# Patient Record
Sex: Female | Born: 1935 | Race: White | Hispanic: No | Smoking: Never smoker
Health system: Southern US, Community
[De-identification: ages and names within clinical notes are randomized; demographics above are authoritative.]

## PROBLEM LIST (undated history)

## (undated) DIAGNOSIS — E164 Increased secretion of gastrin: Secondary | ICD-10-CM

## (undated) DIAGNOSIS — C7A00 Malignant carcinoid tumor of unspecified site: Secondary | ICD-10-CM

## (undated) DIAGNOSIS — I10 Essential (primary) hypertension: Secondary | ICD-10-CM

## (undated) DIAGNOSIS — F329 Major depressive disorder, single episode, unspecified: Secondary | ICD-10-CM

## (undated) DIAGNOSIS — C73 Malignant neoplasm of thyroid gland: Secondary | ICD-10-CM

## (undated) DIAGNOSIS — I499 Cardiac arrhythmia, unspecified: Secondary | ICD-10-CM

## (undated) DIAGNOSIS — E039 Hypothyroidism, unspecified: Secondary | ICD-10-CM

## (undated) DIAGNOSIS — F32A Depression, unspecified: Secondary | ICD-10-CM

## (undated) DIAGNOSIS — K219 Gastro-esophageal reflux disease without esophagitis: Secondary | ICD-10-CM

## (undated) DIAGNOSIS — M1990 Unspecified osteoarthritis, unspecified site: Secondary | ICD-10-CM

## (undated) HISTORY — DX: Malignant carcinoid tumor of unspecified site: C7A.00

## (undated) HISTORY — DX: Increased secretion of gastrin: E16.4

## (undated) HISTORY — PX: ABDOMINAL HYSTERECTOMY: SHX81

## (undated) HISTORY — DX: Malignant neoplasm of thyroid gland: C73

## (undated) HISTORY — PX: LYSIS OF ADHESION: SHX5961

## (undated) HISTORY — PX: THYROID SURGERY: SHX805

## (undated) HISTORY — PX: OOPHORECTOMY: SHX86

---

## 2012-04-21 ENCOUNTER — Emergency Department: Payer: Self-pay | Admitting: *Deleted

## 2012-04-21 LAB — URINALYSIS, COMPLETE
Bacteria: NONE SEEN
Blood: NEGATIVE
Glucose,UR: NEGATIVE mg/dL (ref 0–75)
Ph: 6 (ref 4.5–8.0)
RBC,UR: 1 /HPF (ref 0–5)
Squamous Epithelial: <1
WBC UR: 3 /HPF (ref 0–5)

## 2012-04-21 LAB — COMPREHENSIVE METABOLIC PANEL
Albumin: 3.7 g/dL (ref 3.4–5.0)
Alkaline Phosphatase: 84 U/L (ref 50–136)
BUN: 15 mg/dL (ref 7–18)
Bilirubin,Total: 0.3 mg/dL (ref 0.2–1.0)
Calcium, Total: 8.2 mg/dL — ABNORMAL LOW (ref 8.5–10.1)
Creatinine: 0.83 mg/dL (ref 0.60–1.30)
EGFR (African American): >60
EGFR (Non-African Amer.): >60
Glucose: 151 mg/dL — ABNORMAL HIGH (ref 65–99)
Osmolality: 274 (ref 275–301)
Potassium: 2.9 mmol/L — ABNORMAL LOW (ref 3.5–5.1)
SGPT (ALT): 25 U/L (ref 12–78)
Sodium: 135 mmol/L — ABNORMAL LOW (ref 136–145)
Total Protein: 6.5 g/dL (ref 6.4–8.2)

## 2012-04-21 LAB — CBC
MCH: 31.2 pg (ref 26.0–34.0)
MCV: 91 fL (ref 80–100)
Platelet: 223 10*3/uL (ref 150–440)
RBC: 4.29 10*6/uL (ref 3.80–5.20)
RDW: 13.3 % (ref 11.5–14.5)

## 2012-04-21 LAB — CK TOTAL AND CKMB (NOT AT ARMC): CK, Total: 67 U/L (ref 21–215)

## 2016-01-04 ENCOUNTER — Encounter: Payer: Self-pay | Admitting: "Endocrinology

## 2016-01-04 ENCOUNTER — Ambulatory Visit (INDEPENDENT_AMBULATORY_CARE_PROVIDER_SITE_OTHER): Payer: Medicare Other | Admitting: "Endocrinology

## 2016-01-04 VITALS — BP 145/80 | HR 76 | Ht 67.0 in | Wt 153.0 lb

## 2016-01-04 DIAGNOSIS — E890 Postprocedural hypothyroidism: Secondary | ICD-10-CM | POA: Insufficient documentation

## 2016-01-04 MED ORDER — LEVOTHYROXINE SODIUM 125 MCG PO TABS: 125.0000 ug | ORAL_TABLET | Freq: Every day | ORAL | Status: DC

## 2016-01-04 NOTE — Progress Notes (Signed)
Subjective:    Patient ID: Gwendolyn Bennett, female    DOB: 1927-08-25, PCP Gwendolyn Betters, FNP   Past Medical History  Diagnosis Date  . Zollinger-Ellison syndrome   . Thyroid cancer ([place])   . Malignant carcinoid tumor [facility])    Past Surgical History  Procedure Laterality Date  . Oophorectomy    . Abdominal hysterectomy     Social History   Social History  . Marital Status: Widowed    Spouse Name: N/A  . Number of Children: N/A  . Years of Education: N/A   Social History Main Topics  . Smoking status: Never Smoker   . Smokeless tobacco: None  . Alcohol Use: No  . Drug Use: No  . Sexual Activity: Not Asked   Other Topics Concern  . None   Social History Narrative  . None   Outpatient Encounter Prescriptions as of 01/04/2016  Medication Sig  . atenolol (TENORMIN) 50 MG tablet Take 50 mg by mouth daily.  . calcitRIOL (ROCALTROL) 0.25 MCG capsule Take 0.25 mcg by mouth daily.  . clidinium-chlordiazePOXIDE (LIBRAX) 5-2.5 MG capsule Take 1 capsule by mouth.  . CVS BIOTIN PO Take by mouth.  . esomeprazole (NEXIUM) 40 MG capsule Take 40 mg by mouth 2 (two) times daily before a meal.  . ezetimibe-simvastatin (VYTORIN) 10-20 MG tablet Take 1 tablet by mouth daily.  Marland Kitchen FLUoxetine (PROZAC) 10 MG tablet Take 10 mg by mouth daily.  Marland Kitchen gabapentin (NEURONTIN) 100 MG capsule Take 100 mg by mouth 3 (three) times daily.  . hydrochlorothiazide (HYDRODIURIL) 25 MG tablet Take 25 mg by mouth daily.  Marland Kitchen levothyroxine (SYNTHROID, LEVOTHROID) 125 MCG tablet Take 1 tablet (125 mcg total) by mouth daily before breakfast.  . LORazepam (ATIVAN) 1 MG tablet Take 1 mg by mouth at bedtime.  . [DISCONTINUED] levothyroxine (SYNTHROID, LEVOTHROID) 125 MCG tablet Take 125 mcg by mouth daily before breakfast.   No facility-administered encounter medications on file as of 01/04/2016.   ALLERGIES: Allergies  Allergen Reactions  . Amoxicillin-Pot Clavulanate   . Levaquin [Levofloxacin In D5w]   .  Nitrofuran Derivatives   . Sulfa Antibiotics    VACCINATION STATUS:  There is no immunization history on file for this patient.  HPI Gwendolyn Bennett is 83 year old female patient with medical history as above. She is being seen in consultation for postsurgical hypothyroidism requested by Gwendolyn Betters, FNP. -She reports that she underwent thyroidectomy in 1999 for unidentified thyroid cancer. She was treated with various doses of levothyroxine. In February, 2017  her TSH was above target when she was taking levothyroxine 112 g daily. Since then her levothyroxine was increased to 125 g by mouth every morning. Patient reports compliance. She has seen Dr. Myles Gip in [place] before he moved.  - Her records are not available to review. She denies cold/heat intolerance. She has steady body weight. - She denies family history of thyroid cancer. She denies any exposure to neck radiation.  Review of Systems Constitutional: no weight gain/loss, no fatigue, no subjective hyperthermia/hypothermia Eyes: no blurry vision, no xerophthalmia ENT: no sore throat, no nodules palpated in throat, no dysphagia/odynophagia, no hoarseness Cardiovascular: no CP/SOB/palpitations/leg swelling Respiratory: no cough/SOB Gastrointestinal: no N/V/D/C Musculoskeletal: no muscle/joint aches Skin: no rashes Neurological: no tremors/numbness/tingling/dizziness Psychiatric: no depression/anxiety  Objective:    BP 145/80 mmHg  Pulse 76  Ht 5\' 7"  (1.702 m)  Wt 153 lb (69.4 kg)  BMI 23.96 kg/m2  SpO2 96%  Wt Readings from Last 3 Encounters:  01/04/16 153 lb (69.4 kg)    Physical Exam Constitutional:  in NAD Eyes: PERRLA, EOMI, no exophthalmos ENT: moist mucous membranes, post thyroidectomy scar on anterior lower neck, no cervical lymphadenopathy Cardiovascular: RRR, No MRG Respiratory: CTA B Gastrointestinal: abdomen soft, NT, ND, BS+ Musculoskeletal: no deformities, strength intact in all 4 Skin: moist,  warm, no rashes Neurological: no tremor with outstretched hands, DTR normal in all 4   CMP     Component Value Date/Time   NA 135* 04/21/2012 2006   K 2.9* 04/21/2012 2006   CL 99 04/21/2012 2006   CO2 27 04/21/2012 2006   GLUCOSE 151* 04/21/2012 2006   BUN 15 04/21/2012 2006   CREATININE 0.83 04/21/2012 2006   CALCIUM 8.2* 04/21/2012 2006   PROT 6.5 04/21/2012 2006   ALBUMIN 3.7 04/21/2012 2006   AST 26 04/21/2012 2006   ALT 25 04/21/2012 2006   ALKPHOS 84 04/21/2012 2006   BILITOT 0.3 04/21/2012 2006   GFRNONAA >60 04/21/2012 2006   GFRAA >60 04/21/2012 2006     Assessment & Plan:   1. Postoperative hypothyroidism 2. Thyroid malignancy treated in the late 90s, records not available for review today  -I will obtain thyroid function test today. I advised her to continue on levothyroxine 125 g by mouth every morning.  - We discussed about correct intake of levothyroxine, at fasting, with water, separated by at least 30 minutes from breakfast, and separated by more than 4 hours from calcium, iron, multivitamins, acid reflux medications (PPIs). -Patient is made aware of the fact that thyroid hormone replacement is needed for life, dose to be adjusted by periodic monitoring of thyroid function tests.  -I will request for her old records to review. If thyroid cancer surveillance is not reassuring, I will obtain additional imaging studies after her next visit.  - 45 minutes of time was spent on the care of this patient , 50% of which was applied for counseling on diabetes complications and their preventions.  - I advised patient to maintain close follow up with Gwendolyn Betters, FNP for primary care needs. Follow up plan: Return in about 1 week (around 01/11/2016), or she will get records from Dr. Marveen Reeks , for underactive thyroid, thyroid cancer, labs today.  Glade Lloyd, MD Phone: [phone]  Fax: [phone]   01/04/2016, 5:04 PM

## 2016-01-05 LAB — T4, FREE: FREE T4: 1.9 ng/dL — AB (ref 0.8–1.8)

## 2016-01-05 LAB — TSH: TSH: 0.42 m[IU]/L

## 2016-01-12 ENCOUNTER — Encounter: Payer: Self-pay | Admitting: "Endocrinology

## 2016-01-12 ENCOUNTER — Ambulatory Visit (INDEPENDENT_AMBULATORY_CARE_PROVIDER_SITE_OTHER): Payer: Medicare Other | Admitting: "Endocrinology

## 2016-01-12 VITALS — BP 133/73 | HR 73 | Ht 67.0 in | Wt 155.0 lb

## 2016-01-12 DIAGNOSIS — E038 Other specified hypothyroidism: Secondary | ICD-10-CM | POA: Diagnosis not present

## 2016-01-12 MED ORDER — LEVOTHYROXINE SODIUM 112 MCG PO TABS: 112.0000 ug | ORAL_TABLET | Freq: Every day | ORAL | Status: DC

## 2016-01-12 NOTE — Progress Notes (Signed)
Subjective:    Patient ID: Gwendolyn Bennett, female    DOB: 03/18/1928, PCP Bronwen Betters, FNP   Past Medical History  Diagnosis Date  . Zollinger-Ellison syndrome   . Thyroid cancer ([place])   . Malignant carcinoid tumor [facility])    Past Surgical History  Procedure Laterality Date  . Oophorectomy    . Abdominal hysterectomy     Social History   Social History  . Marital Status: Widowed    Spouse Name: N/A  . Number of Children: N/A  . Years of Education: N/A   Social History Main Topics  . Smoking status: Never Smoker   . Smokeless tobacco: None  . Alcohol Use: No  . Drug Use: No  . Sexual Activity: Not Asked   Other Topics Concern  . None   Social History Narrative   Outpatient Encounter Prescriptions as of 01/12/2016  Medication Sig  . atenolol (TENORMIN) 50 MG tablet Take 50 mg by mouth daily.  . calcitRIOL (ROCALTROL) 0.25 MCG capsule Take 0.25 mcg by mouth daily.  . clidinium-chlordiazePOXIDE (LIBRAX) 5-2.5 MG capsule Take 1 capsule by mouth.  . CVS BIOTIN PO Take by mouth.  . esomeprazole (NEXIUM) 40 MG capsule Take 40 mg by mouth 2 (two) times daily before a meal.  . ezetimibe-simvastatin (VYTORIN) 10-20 MG tablet Take 1 tablet by mouth daily.  Marland Kitchen FLUoxetine (PROZAC) 10 MG tablet Take 10 mg by mouth daily.  Marland Kitchen gabapentin (NEURONTIN) 100 MG capsule Take 100 mg by mouth 3 (three) times daily.  . hydrochlorothiazide (HYDRODIURIL) 25 MG tablet Take 25 mg by mouth daily.  Marland Kitchen levothyroxine (SYNTHROID, LEVOTHROID) 112 MCG tablet Take 1 tablet (112 mcg total) by mouth daily.  Marland Kitchen LORazepam (ATIVAN) 1 MG tablet Take 1 mg by mouth at bedtime.  . [DISCONTINUED] levothyroxine (SYNTHROID, LEVOTHROID) 125 MCG tablet Take 1 tablet (125 mcg total) by mouth daily before breakfast.   No facility-administered encounter medications on file as of 01/12/2016.   ALLERGIES: Allergies  Allergen Reactions  . Amoxicillin-Pot Clavulanate   . Levaquin [Levofloxacin In D5w]   . Nitrofuran  Derivatives   . Sulfa Antibiotics    VACCINATION STATUS:  There is no immunization history on file for this patient.  HPI Gwendolyn Bennett is 82 year old female patient with medical history as above. She is being seen in follow-up for postsurgical hypothyroidism is new set of thyroid function tests. She is currently on Synthroid 125 g by mouth every morning. She has no new complaints.    She denies cold/heat intolerance. She has steady body weight. - She denies family history of thyroid cancer. She denies any exposure to neck radiation.  Review of Systems Constitutional: no weight gain/loss, no fatigue, no subjective hyperthermia/hypothermia Eyes: no blurry vision, no xerophthalmia ENT: no sore throat, no nodules palpated in throat, no dysphagia/odynophagia, no hoarseness Cardiovascular: no CP/SOB/palpitations/leg swelling Respiratory: no cough/SOB Gastrointestinal: no N/V/D/C Musculoskeletal: no muscle/joint aches Skin: no rashes Neurological: no tremors/numbness/tingling/dizziness Psychiatric: no depression/anxiety  Objective:    BP 133/73 mmHg  Pulse 73  Ht 5\' 7"  (1.702 m)  Wt 155 lb (70.308 kg)  BMI 24.27 kg/m2  SpO2 96%  Wt Readings from Last 3 Encounters:  01/12/16 155 lb (70.308 kg)  01/04/16 153 lb (69.4 kg)    Physical Exam Constitutional:  in NAD Eyes: PERRLA, EOMI, no exophthalmos ENT: moist mucous membranes, post thyroidectomy scar on anterior lower neck, no cervical lymphadenopathy Cardiovascular: RRR, No MRG Respiratory: CTA B Gastrointestinal: abdomen soft, NT, ND, BS+ Musculoskeletal:  no deformities, strength intact in all 4 Skin: moist, warm, no rashes Neurological: no tremor with outstretched hands, DTR normal in all 4  Recent Results (from the past 2160 hour(s))  TSH     Status: None   Collection Time: 01/04/16  2:33 PM  Result Value Ref Range   TSH 0.42 mIU/L    Comment:   Reference Range   > or = 20 Years  0.40-4.50   Pregnancy Range First  trimester  0.26-2.66 Second trimester 0.55-2.73 Third trimester  0.43-2.91     T4, free     Status: Abnormal   Collection Time: 01/04/16  2:33 PM  Result Value Ref Range   Free T4 1.9 (H) 0.8 - 1.8 ng/dL     Assessment & Plan:   1. Postoperative hypothyroidism 2. Thyroid malignancy treated in the late 90s, records not available for review today  -Based on her new set of thyroid function tests, her dose will be lowered to 112 g of Synthroid by mouth every morning.   - We discussed about correct intake of levothyroxine, at fasting, with water, separated by at least 30 minutes from breakfast, and separated by more than 4 hours from calcium, iron, multivitamins, acid reflux medications (PPIs). -Patient is made aware of the fact that thyroid hormone replacement is needed for life, dose to be adjusted by periodic monitoring of thyroid function tests.  -I will request for her old records to review. She states she will stop by Dr. Marveen Reeks office in [place] to get her old chart.  If thyroid cancer surveillance is not reassuring, I will obtain additional imaging studies after her next visit.  - I advised patient to maintain close follow up with Bronwen Betters, FNP for primary care needs. Follow up plan: Return in about 3 months (around 04/13/2016) for follow up with pre-visit labs.  Glade Lloyd, MD Phone: [phone]  Fax: [phone]   01/12/2016, 10:04 AM

## 2016-04-21 ENCOUNTER — Other Ambulatory Visit: Payer: Self-pay | Admitting: "Endocrinology

## 2016-04-21 LAB — T4, FREE: FREE T4: 1.8 ng/dL (ref 0.8–1.8)

## 2016-04-21 LAB — TSH: TSH: 0.39 m[IU]/L — AB

## 2016-05-02 ENCOUNTER — Encounter: Payer: Self-pay | Admitting: "Endocrinology

## 2016-05-02 ENCOUNTER — Ambulatory Visit (INDEPENDENT_AMBULATORY_CARE_PROVIDER_SITE_OTHER): Payer: Medicare Other | Admitting: "Endocrinology

## 2016-05-02 VITALS — BP 151/82 | HR 69 | Wt 153.6 lb

## 2016-05-02 DIAGNOSIS — C73 Malignant neoplasm of thyroid gland: Secondary | ICD-10-CM

## 2016-05-02 DIAGNOSIS — E038 Other specified hypothyroidism: Secondary | ICD-10-CM

## 2016-05-02 DIAGNOSIS — Z85850 Personal history of malignant neoplasm of thyroid: Secondary | ICD-10-CM | POA: Insufficient documentation

## 2016-05-02 MED ORDER — LEVOTHYROXINE SODIUM 112 MCG PO TABS: 112.0000 ug | ORAL_TABLET | Freq: Every day | ORAL | 3 refills | Status: DC

## 2016-05-02 NOTE — Progress Notes (Signed)
Subjective:    Patient ID: Gwendolyn Bennett, female    DOB: [birth year], PCP Bronwen Betters, FNP   Past Medical History:  Diagnosis Date  . Malignant carcinoid tumor ([place])   . Thyroid cancer ([place])   . Zollinger-Ellison syndrome    Past Surgical History:  Procedure Laterality Date  . ABDOMINAL HYSTERECTOMY    . OOPHORECTOMY     Social History   Social History  . Marital status: Widowed    Spouse name: N/A  . Number of children: N/A  . Years of education: N/A   Social History Main Topics  . Smoking status: Never Smoker  . Smokeless tobacco: None  . Alcohol use No  . Drug use: No  . Sexual activity: Not Asked   Other Topics Concern  . None   Social History Narrative  . None   Outpatient Encounter Prescriptions as of 05/02/2016  Medication Sig  . atenolol (TENORMIN) 50 MG tablet Take 50 mg by mouth daily.  . calcitRIOL (ROCALTROL) 0.25 MCG capsule Take 0.25 mcg by mouth daily.  . clidinium-chlordiazePOXIDE (LIBRAX) 5-2.5 MG capsule Take 1 capsule by mouth.  . CVS BIOTIN PO Take by mouth.  . esomeprazole (NEXIUM) 40 MG capsule Take 40 mg by mouth 2 (two) times daily before a meal.  . ezetimibe-simvastatin (VYTORIN) 10-20 MG tablet Take 1 tablet by mouth daily.  Marland Kitchen FLUoxetine (PROZAC) 10 MG tablet Take 10 mg by mouth daily.  Marland Kitchen gabapentin (NEURONTIN) 100 MG capsule Take 100 mg by mouth 3 (three) times daily.  . hydrochlorothiazide (HYDRODIURIL) 25 MG tablet Take 25 mg by mouth daily.  Marland Kitchen levothyroxine (SYNTHROID, LEVOTHROID) 112 MCG tablet Take 1 tablet (112 mcg total) by mouth daily.  Marland Kitchen LORazepam (ATIVAN) 1 MG tablet Take 1 mg by mouth at bedtime.  . [DISCONTINUED] levothyroxine (SYNTHROID, LEVOTHROID) 112 MCG tablet Take 1 tablet (112 mcg total) by mouth daily.   No facility-administered encounter medications on file as of 05/02/2016.    ALLERGIES: Allergies  Allergen Reactions  . Amoxicillin-Pot Clavulanate   . Levaquin [Levofloxacin In D5w]   . Nitrofuran  Derivatives   . Sulfa Antibiotics    VACCINATION STATUS:  There is no immunization history on file for this patient.  Thyroid Problem  Presents for follow-up visit.   Mrs. Krupa is 82 year old female patient with medical history as above. She is being seen in follow-up for postsurgical hypothyroidism is new set of thyroid function tests. She is currently on Synthroid 112 g by mouth every morning. She has no new complaints.    She denies cold/heat intolerance. She has steady body weight. - She denies family history of thyroid cancer. She denies any exposure to neck radiation.  Review of Systems Constitutional: no weight gain/loss, no fatigue, no subjective hyperthermia/hypothermia Eyes: no blurry vision, no xerophthalmia ENT: no sore throat, no nodules palpated in throat, no dysphagia/odynophagia, no hoarseness Cardiovascular: no CP/SOB/palpitations/leg swelling Respiratory: no cough/SOB Gastrointestinal: no N/V/D/C Musculoskeletal: no muscle/joint aches Skin: no rashes Neurological: no tremors/numbness/tingling/dizziness Psychiatric: no depression/anxiety  Objective:    BP (!) 151/82   Pulse 69   Wt 153 lb 9.6 oz (69.7 kg)   BMI 24.06 kg/m   Wt Readings from Last 3 Encounters:  05/02/16 153 lb 9.6 oz (69.7 kg)  01/12/16 155 lb (70.3 kg)  01/04/16 153 lb (69.4 kg)    Physical Exam Constitutional:  in NAD Eyes: PERRLA, EOMI, no exophthalmos ENT: moist mucous membranes, post thyroidectomy scar on anterior lower neck, no cervical lymphadenopathy  Cardiovascular: RRR, No MRG Respiratory: CTA B Gastrointestinal: abdomen soft, NT, ND, BS+ Musculoskeletal: no deformities, strength intact in all 4 Skin: moist, warm, no rashes Neurological: no tremor with outstretched hands, DTR normal in all 4  Recent Results (from the past 2160 hour(s))  TSH     Status: Abnormal   Collection Time: 04/21/16 11:48 AM  Result Value Ref Range   TSH 0.39 (L) mIU/L    Comment:   Reference  Range   > or = 20 Years  0.40-4.50   Pregnancy Range First trimester  0.26-2.66 Second trimester 0.55-2.73 Third trimester  0.43-2.91     T4, free     Status: None   Collection Time: 04/21/16 11:48 AM  Result Value Ref Range   Free T4 1.8 0.8 - 1.8 ng/dL     Assessment & Plan:   1. Postoperative hypothyroidism 2. Thyroid malignancy treated in the late 90s, records not available for review today  -Based on her new set of thyroid function tests, her dose Of Synthroid at  112 g , seems to be the right dose.    - We discussed about correct intake of levothyroxine, at fasting, with water, separated by at least 30 minutes from breakfast, and separated by more than 4 hours from calcium, iron, multivitamins, acid reflux medications (PPIs). -Patient is made aware of the fact that thyroid hormone replacement is needed for life, dose to be adjusted by periodic monitoring of thyroid function tests.  - I will proceed to obtain thyroid/neck ultrasound given her history of thyroid cancer in the remote past. - I advised patient to maintain close follow up with Bronwen Betters, FNP for primary care needs. Follow up plan: Return in about 3 months (around 08/01/2016) for follow up with pre-visit labs, Thyroid / Neck Ultrasound.  Glade Lloyd, MD Phone: [phone]  Fax: [phone]   05/02/2016, 11:49 AM

## 2016-07-27 ENCOUNTER — Other Ambulatory Visit: Payer: Self-pay | Admitting: "Endocrinology

## 2016-07-27 ENCOUNTER — Ambulatory Visit (HOSPITAL_COMMUNITY)
Admission: RE | Admit: 2016-07-27 | Discharge: 2016-07-27 | Disposition: A | Discharge status: Home / Self Care | Payer: Medicare Other | Source: Ambulatory Visit | Attending: "Endocrinology | Admitting: "Endocrinology

## 2016-07-27 DIAGNOSIS — C73 Malignant neoplasm of thyroid gland: Secondary | ICD-10-CM | POA: Insufficient documentation

## 2016-07-27 LAB — T4, FREE: Free T4: 1.9 ng/dL — ABNORMAL HIGH (ref 0.8–1.8)

## 2016-07-27 LAB — TSH: TSH: 0.39 mIU/L — ABNORMAL LOW

## 2016-08-01 ENCOUNTER — Ambulatory Visit (INDEPENDENT_AMBULATORY_CARE_PROVIDER_SITE_OTHER): Payer: Medicare Other | Admitting: "Endocrinology

## 2016-08-01 ENCOUNTER — Ambulatory Visit: Payer: Medicare Other | Admitting: "Endocrinology

## 2016-08-01 ENCOUNTER — Encounter: Payer: Self-pay | Admitting: "Endocrinology

## 2016-08-01 VITALS — BP 133/82 | HR 91 | Ht 67.0 in | Wt 155.0 lb

## 2016-08-01 DIAGNOSIS — E890 Postprocedural hypothyroidism: Secondary | ICD-10-CM

## 2016-08-01 DIAGNOSIS — C73 Malignant neoplasm of thyroid gland: Secondary | ICD-10-CM | POA: Diagnosis not present

## 2016-08-01 MED ORDER — LEVOTHYROXINE SODIUM 100 MCG PO TABS: 100.0000 ug | ORAL_TABLET | Freq: Every day | ORAL | 1 refills | Status: DC

## 2016-08-01 NOTE — Progress Notes (Signed)
Subjective:    Patient ID: Gwendolyn Bennett, female    DOB: 1928/01/28, PCP Bronwen Betters, FNP   Past Medical History:  Diagnosis Date  . Malignant carcinoid tumor ([place])   . Thyroid cancer ([place])   . Zollinger-Ellison syndrome    Past Surgical History:  Procedure Laterality Date  . ABDOMINAL HYSTERECTOMY    . OOPHORECTOMY     Social History   Social History  . Marital status: Widowed    Spouse name: N/A  . Number of children: N/A  . Years of education: N/A   Social History Main Topics  . Smoking status: Never Smoker  . Smokeless tobacco: Never Used  . Alcohol use No  . Drug use: No  . Sexual activity: Not Asked   Other Topics Concern  . None   Social History Narrative  . None   Outpatient Encounter Prescriptions as of 08/01/2016  Medication Sig  . atenolol (TENORMIN) 50 MG tablet Take 50 mg by mouth daily.  . calcitRIOL (ROCALTROL) 0.25 MCG capsule Take 0.25 mcg by mouth daily.  . clidinium-chlordiazePOXIDE (LIBRAX) 5-2.5 MG capsule Take 1 capsule by mouth.  . CVS BIOTIN PO Take by mouth.  . esomeprazole (NEXIUM) 40 MG capsule Take 40 mg by mouth 2 (two) times daily before a meal.  . ezetimibe-simvastatin (VYTORIN) 10-20 MG tablet Take 1 tablet by mouth daily.  Marland Kitchen FLUoxetine (PROZAC) 10 MG tablet Take 10 mg by mouth daily.  Marland Kitchen gabapentin (NEURONTIN) 100 MG capsule Take 100 mg by mouth 3 (three) times daily.  . hydrochlorothiazide (HYDRODIURIL) 25 MG tablet Take 25 mg by mouth daily.  Marland Kitchen levothyroxine (SYNTHROID, LEVOTHROID) 100 MCG tablet Take 1 tablet (100 mcg total) by mouth daily before breakfast.  . LORazepam (ATIVAN) 1 MG tablet Take 1 mg by mouth at bedtime.  . [DISCONTINUED] levothyroxine (SYNTHROID, LEVOTHROID) 112 MCG tablet Take 1 tablet (112 mcg total) by mouth daily.   No facility-administered encounter medications on file as of 08/01/2016.    ALLERGIES: Allergies  Allergen Reactions  . Amoxicillin-Pot Clavulanate   . Levaquin [Levofloxacin In D5w]    . Nitrofuran Derivatives   . Sulfa Antibiotics    VACCINATION STATUS:  There is no immunization history on file for this patient.  Thyroid Problem  Presents for follow-up visit.   Gwendolyn Bennett is 82 year old female patient with medical history as above. She is being seen in follow-up for postsurgical hypothyroidism is new set of thyroid function tests. She is currently on Synthroid 112 g by mouth every morning. She has no new complaints.    She denies cold/heat intolerance. She has steady body weight. - She denies family history of thyroid cancer. She denies any exposure to neck radiation.  Review of Systems Constitutional: no weight gain/loss, no fatigue, no subjective hyperthermia/hypothermia Eyes: no blurry vision, no xerophthalmia ENT: no sore throat, no nodules palpated in throat, no dysphagia/odynophagia, no hoarseness Cardiovascular: no CP/SOB/palpitations/leg swelling Respiratory: no cough/SOB Gastrointestinal: no N/V/D/C Musculoskeletal: no muscle/joint aches Skin: no rashes Neurological: no tremors/numbness/tingling/dizziness Psychiatric: no depression/anxiety  Objective:    BP 133/82   Pulse 91   Ht 5\' 7"  (1.702 m)   Wt 155 lb (70.3 kg)   BMI 24.28 kg/m   Wt Readings from Last 3 Encounters:  08/01/16 155 lb (70.3 kg)  05/02/16 153 lb 9.6 oz (69.7 kg)  01/12/16 155 lb (70.3 kg)    Physical Exam Constitutional:  in NAD Eyes: PERRLA, EOMI, no exophthalmos ENT: moist mucous membranes, post thyroidectomy scar  on anterior lower neck, no cervical lymphadenopathy Cardiovascular: RRR, No MRG Respiratory: CTA B Gastrointestinal: abdomen soft, NT, ND, BS+ Musculoskeletal: no deformities, strength intact in all 4 Skin: moist, warm, no rashes Neurological: no tremor with outstretched hands, DTR normal in all 4  Recent Results (from the past 2160 hour(s))  TSH     Status: Abnormal   Collection Time: 07/27/16 10:14 AM  Result Value Ref Range   TSH 0.39 (L) mIU/L     Comment:   Reference Range   > or = 20 Years  0.40-4.50   Pregnancy Range First trimester  0.26-2.66 Second trimester 0.55-2.73 Third trimester  0.43-2.91     T4, free     Status: Abnormal   Collection Time: 07/27/16 10:14 AM  Result Value Ref Range   Free T4 1.9 (H) 0.8 - 1.8 ng/dL   - Thyroid/neck ultrasound  on 07/27/2016 is consistent with surgically absent thyroid.  Assessment & Plan:   1. Postoperative hypothyroidism 2. Thyroid malignancy treated in the late 90s, records not available for review today  -Based on her new set of thyroid function tests, She is being over replaced. I discussed and lowered her Synthroid to 100 g by mouth every morning with plan to repeat thyroid function test in 6 months with office visit.  - We discussed about correct intake of levothyroxine, at fasting, with water, separated by at least 30 minutes from breakfast, and separated by more than 4 hours from calcium, iron, multivitamins, acid reflux medications (PPIs). -Patient is made aware of the fact that thyroid hormone replacement is needed for life, dose to be adjusted by periodic monitoring of thyroid function tests.  - Her surveillance neck/I wrote ultrasound was unremarkable for surgically absent thyroid, no need for further intervention or workup. - I advised patient to maintain close follow up with Bronwen Betters, FNP for primary care needs. Follow up plan: Return in about 6 months (around 01/30/2017) for follow up with pre-visit labs.  Glade Lloyd, MD Phone: [phone]  Fax: [phone]   08/01/2016, 11:15 AM

## 2016-09-20 ENCOUNTER — Ambulatory Visit: Payer: Self-pay | Admitting: Orthopedic Surgery

## 2016-10-04 ENCOUNTER — Ambulatory Visit: Payer: Self-pay | Admitting: Orthopedic Surgery

## 2016-10-04 NOTE — H&P (Signed)
TOTAL HIP ADMISSION H&P  Patient is admitted for right total hip arthroplasty.  Subjective:  Chief Complaint: right hip pain  HPI: Gwendolyn Bennett, 83 y.o. female, has a history of pain and functional disability in the right hip(s) due to arthritis and patient has failed non-surgical conservative treatments for greater than 12 weeks to include NSAID's and/or analgesics, flexibility and strengthening excercises, use of assistive devices, weight reduction as appropriate and activity modification.  Onset of symptoms was gradual starting 3 years ago with gradually worsening course since that time.The patient noted no past surgery on the right hip(s).  Patient currently rates pain in the right hip at 10 out of 10 with activity. Patient has night pain, worsening of pain with activity and weight bearing, pain that interfers with activities of daily living, pain with passive range of motion and crepitus. Patient has evidence of subchondral cysts, subchondral sclerosis, periarticular osteophytes and joint space narrowing by imaging studies. This condition presents safety issues increasing the risk of falls.  There is no current active infection.  Patient Active Problem List   Diagnosis Date Noted  . Malignant neoplasm of thyroid gland ([place]) 05/02/2016  . Postsurgical hypothyroidism 01/04/2016   Past Medical History:  Diagnosis Date  . Malignant carcinoid tumor ([place])   . Thyroid cancer ([place])   . Zollinger-Ellison syndrome     Past Surgical History:  Procedure Laterality Date  . ABDOMINAL HYSTERECTOMY    . OOPHORECTOMY       (Not in a hospital admission) Allergies  Allergen Reactions  . Amoxicillin-Pot Clavulanate   . Levaquin [Levofloxacin In D5w]   . Nitrofuran Derivatives   . Sulfa Antibiotics     Social History  Substance Use Topics  . Smoking status: Never Smoker  . Smokeless tobacco: Never Used  . Alcohol use No    No family history on file.   Review of Systems  Constitutional:  Negative.   HENT: Positive for tinnitus.   Gastrointestinal: Positive for constipation.  Musculoskeletal: Positive for joint pain.  Skin: Negative.   Neurological: Negative.   Endo/Heme/Allergies: Negative.   Psychiatric/Behavioral: Negative.     Objective:  Physical Exam  Vitals reviewed. Constitutional: She is oriented to person, place, and time. She appears well-developed and well-nourished.  HENT:  Head: Normocephalic and atraumatic.  Eyes: Conjunctivae and EOM are normal.  Neck: Normal range of motion. Neck supple.  Cardiovascular: Normal rate, regular rhythm and intact distal pulses.   Respiratory: Effort normal. No respiratory distress.  GI: Soft. She exhibits no distension.  Genitourinary:  Genitourinary Comments: deferred  Musculoskeletal:       Right hip: She exhibits decreased range of motion.  Neurological: She is alert and oriented to person, place, and time. She has normal reflexes.  Skin: Skin is warm and dry.  Psychiatric: She has a normal mood and affect. Her behavior is normal. Judgment and thought content normal.    Vital signs in last 24 hours: @VSRANGES @  Labs:   Estimated body mass index is 24.28 kg/m as calculated from the following:   Height as of 08/01/16: 5\' 7"  (1.702 m).   Weight as of 08/01/16: 70.3 kg (155 lb).   Imaging Review Plain radiographs demonstrate severe degenerative joint disease of the right hip(s). The bone quality appears to be adequate for age and reported activity level.  Assessment/Plan:  End stage arthritis, right hip(s)  The patient history, physical examination, clinical judgement of the provider and imaging studies are consistent with end stage degenerative joint disease  of the right hip(s) and total hip arthroplasty is deemed medically necessary. The treatment options including medical management, injection therapy, arthroscopy and arthroplasty were discussed at length. The risks and benefits of total hip  arthroplasty were presented and reviewed. The risks due to aseptic loosening, infection, stiffness, dislocation/subluxation,  thromboembolic complications and other imponderables were discussed.  The patient acknowledged the explanation, agreed to proceed with the plan and consent was signed. Patient is being admitted for inpatient treatment for surgery, pain control, PT, OT, prophylactic antibiotics, VTE prophylaxis, progressive ambulation and ADL's and discharge planning.The patient is planning to be discharged home with HEP.

## 2016-10-07 NOTE — Pre-Procedure Instructions (Signed)
    Retaj Rybicki  10/07/2016      [place], [place] - [address] [place] [address] Phone: [phone] Fax: [phone]     Your procedure is scheduled on Fri. Feb. 26 @ 1045 AM.  Report to [place] at [place].M.  Call this number if you have problems the morning of surgery:  [phone]   Remember:  Do not eat food or drink liquids after midnight.  Take these medicines the morning of surgery with A SIP OF WATER acetaminophen (tylenol), atenolol (telnormin), esomeprazole (nexium), fluoxetine (prozac), gabapentin (neurontin), levothyroxine (synthroid), lorazepam (ativan).   Do not wear jewelry, make-up or nail polish.  Do not wear lotions, powders, or perfumes, or deoderant.  Do not shave 48 hours prior to surgery.    Do not bring valuables to the hospital.  [facility] is not responsible for any belongings or valuables.  Contacts, dentures or bridgework may not be worn into surgery.  Leave your suitcase in the car.  After surgery it may be brought to your room.  For patients admitted to the hospital, discharge time will be determined by your treatment team.  Patients discharged the day of surgery will not be allowed to drive home.   Special instructions:  See attached  Please read over the following fact sheets that you were given. Pain Booklet, Coughing and Deep Breathing, MRSA Information and Surgical Site Infection Prevention

## 2016-10-10 ENCOUNTER — Encounter (HOSPITAL_COMMUNITY): Payer: Self-pay | Admitting: *Deleted

## 2016-10-10 ENCOUNTER — Encounter (HOSPITAL_COMMUNITY)
Admission: RE | Admit: 2016-10-10 | Discharge: 2016-10-10 | Disposition: A | Discharge status: Home / Self Care | Payer: Medicare Other | Source: Ambulatory Visit | Attending: Orthopedic Surgery | Admitting: Orthopedic Surgery

## 2016-10-10 DIAGNOSIS — Z01812 Encounter for preprocedural laboratory examination: Secondary | ICD-10-CM | POA: Diagnosis not present

## 2016-10-10 DIAGNOSIS — Z85850 Personal history of malignant neoplasm of thyroid: Secondary | ICD-10-CM | POA: Diagnosis not present

## 2016-10-10 DIAGNOSIS — Z9889 Other specified postprocedural states: Secondary | ICD-10-CM | POA: Insufficient documentation

## 2016-10-10 DIAGNOSIS — M25551 Pain in right hip: Secondary | ICD-10-CM | POA: Diagnosis not present

## 2016-10-10 DIAGNOSIS — M1611 Unilateral primary osteoarthritis, right hip: Secondary | ICD-10-CM | POA: Insufficient documentation

## 2016-10-10 DIAGNOSIS — Z90710 Acquired absence of both cervix and uterus: Secondary | ICD-10-CM | POA: Diagnosis not present

## 2016-10-10 DIAGNOSIS — Z79899 Other long term (current) drug therapy: Secondary | ICD-10-CM | POA: Insufficient documentation

## 2016-10-10 HISTORY — DX: Essential (primary) hypertension: I10

## 2016-10-10 HISTORY — DX: Gastro-esophageal reflux disease without esophagitis: K21.9

## 2016-10-10 HISTORY — DX: Hypothyroidism, unspecified: E03.9

## 2016-10-10 HISTORY — DX: Unspecified osteoarthritis, unspecified site: M19.90

## 2016-10-10 HISTORY — DX: Depression, unspecified: F32.A

## 2016-10-10 HISTORY — DX: Major depressive disorder, single episode, unspecified: F32.9

## 2016-10-10 HISTORY — DX: Cardiac arrhythmia, unspecified: I49.9

## 2016-10-10 LAB — CBC
HCT: 40.2 % (ref 36.0–46.0)
Hemoglobin: 13.6 g/dL (ref 12.0–15.0)
MCH: 30.6 pg (ref 26.0–34.0)
MCHC: 33.8 g/dL (ref 30.0–36.0)
MCV: 90.5 fL (ref 78.0–100.0)
PLATELETS: 310 10*3/uL (ref 150–400)
RBC: 4.44 MIL/uL (ref 3.87–5.11)
RDW: 13 % (ref 11.5–15.5)
WBC: 10 10*3/uL (ref 4.0–10.5)

## 2016-10-10 LAB — SURGICAL PCR SCREEN
MRSA, PCR: NEGATIVE
Staphylococcus aureus: NEGATIVE

## 2016-10-10 LAB — BASIC METABOLIC PANEL
Anion gap: 9 (ref 5–15)
BUN: 11 mg/dL (ref 6–20)
CHLORIDE: 92 mmol/L — AB (ref 101–111)
CO2: 32 mmol/L (ref 22–32)
CREATININE: 0.67 mg/dL (ref 0.44–1.00)
Calcium: 9 mg/dL (ref 8.9–10.3)
GFR calc non Af Amer: >60 mL/min (ref >60)
Glucose, Bld: 102 mg/dL — ABNORMAL HIGH (ref 65–99)
Potassium: 3.5 mmol/L (ref 3.5–5.1)
Sodium: 133 mmol/L — ABNORMAL LOW (ref 135–145)

## 2016-10-10 LAB — TYPE AND SCREEN
ABO/RH(D): A POS
Antibody Screen: NEGATIVE

## 2016-10-10 LAB — ABO/RH: ABO/RH(D): A POS

## 2016-10-10 NOTE — Progress Notes (Addendum)
PCP is Dr. Kennieth Rad Dr. Dorris Fetch is tyroid Dr.  Cardiologist is Dr. Darral Dash Denies any chest pain or fevers. States she had an EKg a few weeks ago request sent to Dr Shearon Stalls office. Request sent for last office visit, stress test and echo from Dr Darral Dash Pt states she was cleared for surgery.

## 2016-10-11 NOTE — Progress Notes (Addendum)
Anesthesia Chart Review:  Pt is an 83 year old female scheduled for R total hip arthroplasty anterior approach on 10/17/2016 with Rod Can, MD.   - Cardiologist is Raechel Chute, MD in [place], [place], who has cleared pt for surgery.  - Oncologist is Leslie Andrea, MD (notes in care everywhere).  - PCP is Dionisio Paschal, MD, who has cleared pt for surgery.   PMH includes:  Metastatic pancreatic malignant neuroendocrine tumor, hepatic metastases, Zollinger-Ellison syndrome, HTN, dysrhythmia, hypothyroidism, thyroid cancer, GERD. Never smoker. BMI 25.  Medications include: atenolol, nexium, librax, HCTZ, levothyroxine.   Preoperative labs reviewed.    EKG 06/22/16: Sinus rhythm. RBBB.  Nuclear stress test 03/26/15 [facility] No inducible ischemia or abnormal flow reserve vasodilator stress and normal resting LV systolic function.  Echo 03/24/15 [facility]): 1. Normal LV size, wall thickness, estimated EF greater than 60%. Grade 1 LV filling pattern with likely normal resting LV filling pressure and normal LA pressure. 2. Normal RV size and systolic function. 3. Structurally normal mitral valve with mild to moderate mitral insufficiency. 4. Normal tricuspid valve trace tricuspid insufficiency. 5. Normal aortic and pulmonic valves.  If no changes, I anticipate pt can proceed with surgery as scheduled.   Willeen Cass, FNP-BC [facility]/Anesthesiology Phone: [phone] 10/13/2016 2:41 PM

## 2016-10-14 MED ORDER — ACETAMINOPHEN 10 MG/ML IV SOLN
1000.0000 mg | INTRAVENOUS | Status: AC
  Administered 2016-10-17: 1000 mg via INTRAVENOUS
  Filled 2016-10-14: qty 100

## 2016-10-14 MED ORDER — CEFAZOLIN SODIUM-DEXTROSE 2-4 GM/100ML-% IV SOLN
2.0000 g | INTRAVENOUS | Status: AC
  Administered 2016-10-17: 2 g via INTRAVENOUS

## 2016-10-14 MED ORDER — TRANEXAMIC ACID 1000 MG/10ML IV SOLN
1000.0000 mg | INTRAVENOUS | Status: AC
  Administered 2016-10-17: 1000 mg via INTRAVENOUS
  Filled 2016-10-14: qty 10

## 2016-10-14 MED ORDER — SODIUM CHLORIDE 0.9 % IV SOLN: INTRAVENOUS | Status: DC

## 2016-10-17 ENCOUNTER — Encounter (HOSPITAL_COMMUNITY)
Admission: RE | Disposition: A | Discharge status: Home Health | Payer: Self-pay | Source: Ambulatory Visit | Attending: Orthopedic Surgery

## 2016-10-17 ENCOUNTER — Inpatient Hospital Stay (HOSPITAL_COMMUNITY): Payer: Medicare Other

## 2016-10-17 ENCOUNTER — Inpatient Hospital Stay (HOSPITAL_COMMUNITY): Payer: Medicare Other | Admitting: Emergency Medicine

## 2016-10-17 ENCOUNTER — Encounter (HOSPITAL_COMMUNITY): Payer: Self-pay | Admitting: Surgery

## 2016-10-17 ENCOUNTER — Inpatient Hospital Stay (HOSPITAL_COMMUNITY)
Admission: RE | Admit: 2016-10-17 | Discharge: 2016-10-18 | DRG: 470 | Disposition: A | Discharge status: Home Health | Payer: Medicare Other | Source: Ambulatory Visit | Attending: Orthopedic Surgery | Admitting: Orthopedic Surgery

## 2016-10-17 ENCOUNTER — Inpatient Hospital Stay (HOSPITAL_COMMUNITY): Payer: Medicare Other | Admitting: Certified Registered Nurse Anesthetist

## 2016-10-17 DIAGNOSIS — I10 Essential (primary) hypertension: Secondary | ICD-10-CM | POA: Diagnosis present

## 2016-10-17 DIAGNOSIS — E164 Increased secretion of gastrin: Secondary | ICD-10-CM | POA: Diagnosis present

## 2016-10-17 DIAGNOSIS — Z09 Encounter for follow-up examination after completed treatment for conditions other than malignant neoplasm: Secondary | ICD-10-CM

## 2016-10-17 DIAGNOSIS — Z90710 Acquired absence of both cervix and uterus: Secondary | ICD-10-CM

## 2016-10-17 DIAGNOSIS — E890 Postprocedural hypothyroidism: Secondary | ICD-10-CM | POA: Diagnosis present

## 2016-10-17 DIAGNOSIS — Z880 Allergy status to penicillin: Secondary | ICD-10-CM | POA: Diagnosis not present

## 2016-10-17 DIAGNOSIS — K219 Gastro-esophageal reflux disease without esophagitis: Secondary | ICD-10-CM | POA: Diagnosis present

## 2016-10-17 DIAGNOSIS — Z882 Allergy status to sulfonamides status: Secondary | ICD-10-CM | POA: Diagnosis not present

## 2016-10-17 DIAGNOSIS — Z888 Allergy status to other drugs, medicaments and biological substances status: Secondary | ICD-10-CM

## 2016-10-17 DIAGNOSIS — Z85850 Personal history of malignant neoplasm of thyroid: Secondary | ICD-10-CM | POA: Diagnosis not present

## 2016-10-17 DIAGNOSIS — M1611 Unilateral primary osteoarthritis, right hip: Principal | ICD-10-CM | POA: Diagnosis present

## 2016-10-17 DIAGNOSIS — M1990 Unspecified osteoarthritis, unspecified site: Secondary | ICD-10-CM

## 2016-10-17 HISTORY — PX: TOTAL HIP ARTHROPLASTY: SHX124

## 2016-10-17 SURGERY — ARTHROPLASTY, HIP, TOTAL, ANTERIOR APPROACH
Anesthesia: Spinal | Site: Hip | Laterality: Right

## 2016-10-17 MED ORDER — SODIUM CHLORIDE 0.9 % IJ SOLN
INTRAMUSCULAR | Status: DC | PRN
  Administered 2016-10-17: 30 mL

## 2016-10-17 MED ORDER — GLYCOPYRROLATE 0.2 MG/ML IJ SOLN
INTRAMUSCULAR | Status: DC | PRN
  Administered 2016-10-17: 0.2 mg via INTRAVENOUS

## 2016-10-17 MED ORDER — CHLORHEXIDINE GLUCONATE 4 % EX LIQD: 60.0000 mL | Freq: Once | CUTANEOUS | Status: DC

## 2016-10-17 MED ORDER — CALCITRIOL 0.25 MCG PO CAPS
0.2500 ug | ORAL_CAPSULE | Freq: Every day | ORAL | Status: DC
  Administered 2016-10-18: 0.25 ug via ORAL
  Filled 2016-10-17: qty 1

## 2016-10-17 MED ORDER — DEXAMETHASONE SODIUM PHOSPHATE 10 MG/ML IJ SOLN
10.0000 mg | Freq: Once | INTRAMUSCULAR | Status: AC
  Administered 2016-10-18: 10 mg via INTRAVENOUS
  Filled 2016-10-17: qty 1

## 2016-10-17 MED ORDER — LIDOCAINE HCL (CARDIAC) 20 MG/ML IV SOLN
INTRAVENOUS | Status: DC | PRN
  Administered 2016-10-17: 60 mg via INTRAVENOUS

## 2016-10-17 MED ORDER — SENNA 8.6 MG PO TABS
2.0000 | ORAL_TABLET | Freq: Every day | ORAL | Status: DC
  Administered 2016-10-17: 17.2 mg via ORAL
  Filled 2016-10-17: qty 2

## 2016-10-17 MED ORDER — GABAPENTIN 100 MG PO CAPS
100.0000 mg | ORAL_CAPSULE | Freq: Three times a day (TID) | ORAL | Status: DC
  Administered 2016-10-17 – 2016-10-18 (×3): 100 mg via ORAL
  Filled 2016-10-17 (×3): qty 1

## 2016-10-17 MED ORDER — BUPIVACAINE-EPINEPHRINE (PF) 0.5% -1:200000 IJ SOLN
INTRAMUSCULAR | Status: AC
  Filled 2016-10-17: qty 30

## 2016-10-17 MED ORDER — KETOROLAC TROMETHAMINE 30 MG/ML IJ SOLN
INTRAMUSCULAR | Status: AC
  Filled 2016-10-17: qty 1

## 2016-10-17 MED ORDER — MENTHOL 3 MG MT LOZG
1.0000 | LOZENGE | OROMUCOSAL | Status: DC | PRN
  Administered 2016-10-17: 3 mg via ORAL
  Filled 2016-10-17: qty 9

## 2016-10-17 MED ORDER — DIPHENHYDRAMINE HCL 12.5 MG/5ML PO ELIX
12.5000 mg | ORAL_SOLUTION | ORAL | Status: DC | PRN
Start: 2016-10-17 — End: 2016-10-18

## 2016-10-17 MED ORDER — ONDANSETRON HCL 4 MG PO TABS: 4.0000 mg | ORAL_TABLET | Freq: Four times a day (QID) | ORAL | Status: DC | PRN

## 2016-10-17 MED ORDER — SODIUM CHLORIDE 0.9 % IV SOLN
INTRAVENOUS | Status: DC
  Administered 2016-10-17 – 2016-10-18 (×3): via INTRAVENOUS

## 2016-10-17 MED ORDER — FLUOXETINE HCL 20 MG PO CAPS
20.0000 mg | ORAL_CAPSULE | Freq: Every day | ORAL | Status: DC
  Administered 2016-10-18: 20 mg via ORAL
  Filled 2016-10-17: qty 1

## 2016-10-17 MED ORDER — METHOCARBAMOL 1000 MG/10ML IJ SOLN
500.0000 mg | Freq: Four times a day (QID) | INTRAVENOUS | Status: DC | PRN
  Filled 2016-10-17: qty 5

## 2016-10-17 MED ORDER — SODIUM CHLORIDE 0.9 % IR SOLN
Status: DC | PRN
  Administered 2016-10-17: 3000 mL

## 2016-10-17 MED ORDER — LIDOCAINE 2% (20 MG/ML) 5 ML SYRINGE
INTRAMUSCULAR | Status: AC
  Filled 2016-10-17: qty 10

## 2016-10-17 MED ORDER — HYDROCODONE-ACETAMINOPHEN 5-325 MG PO TABS: 1.0000 | ORAL_TABLET | ORAL | 0 refills | Status: DC | PRN

## 2016-10-17 MED ORDER — ACETAMINOPHEN 650 MG RE SUPP: 650.0000 mg | Freq: Four times a day (QID) | RECTAL | Status: DC | PRN

## 2016-10-17 MED ORDER — ONDANSETRON HCL 4 MG PO TABS: 4.0000 mg | ORAL_TABLET | Freq: Four times a day (QID) | ORAL | 0 refills | Status: DC | PRN

## 2016-10-17 MED ORDER — HYDROMORPHONE HCL 2 MG/ML IJ SOLN
0.5000 mg | INTRAMUSCULAR | Status: DC | PRN
Start: 2016-10-17 — End: 2016-10-18

## 2016-10-17 MED ORDER — TRANEXAMIC ACID 1000 MG/10ML IV SOLN
1000.0000 mg | Freq: Once | INTRAVENOUS | Status: AC
  Administered 2016-10-17: 1000 mg via INTRAVENOUS
  Filled 2016-10-17: qty 10

## 2016-10-17 MED ORDER — BIOTIN 5 MG PO CAPS: ORAL_CAPSULE | Freq: Every day | ORAL | Status: DC

## 2016-10-17 MED ORDER — ONDANSETRON HCL 4 MG/2ML IJ SOLN: 4.0000 mg | Freq: Four times a day (QID) | INTRAMUSCULAR | Status: DC | PRN

## 2016-10-17 MED ORDER — ATENOLOL 50 MG PO TABS
50.0000 mg | ORAL_TABLET | Freq: Every day | ORAL | Status: DC
  Filled 2016-10-17: qty 1

## 2016-10-17 MED ORDER — PANTOPRAZOLE SODIUM 40 MG PO TBEC
80.0000 mg | DELAYED_RELEASE_TABLET | Freq: Every day | ORAL | Status: DC
  Administered 2016-10-18: 80 mg via ORAL
  Filled 2016-10-17: qty 2

## 2016-10-17 MED ORDER — LEVOTHYROXINE SODIUM 100 MCG PO TABS
100.0000 ug | ORAL_TABLET | Freq: Every day | ORAL | Status: DC
  Administered 2016-10-18: 100 ug via ORAL
  Filled 2016-10-17: qty 1

## 2016-10-17 MED ORDER — DOCUSATE SODIUM 100 MG PO CAPS
100.0000 mg | ORAL_CAPSULE | Freq: Two times a day (BID) | ORAL | Status: DC
  Administered 2016-10-17 – 2016-10-18 (×2): 100 mg via ORAL
  Filled 2016-10-17 (×2): qty 1

## 2016-10-17 MED ORDER — PHENOL 1.4 % MT LIQD: 1.0000 | OROMUCOSAL | Status: DC | PRN

## 2016-10-17 MED ORDER — BUPIVACAINE-EPINEPHRINE (PF) 0.5% -1:200000 IJ SOLN
INTRAMUSCULAR | Status: DC | PRN
  Administered 2016-10-17: 30 mL

## 2016-10-17 MED ORDER — ACETAMINOPHEN 325 MG PO TABS: 650.0000 mg | ORAL_TABLET | Freq: Four times a day (QID) | ORAL | Status: DC | PRN

## 2016-10-17 MED ORDER — PHENYLEPHRINE HCL 10 MG/ML IJ SOLN
INTRAVENOUS | Status: DC | PRN
  Administered 2016-10-17: 20 ug/min via INTRAVENOUS

## 2016-10-17 MED ORDER — KETOROLAC TROMETHAMINE 30 MG/ML IJ SOLN
INTRAMUSCULAR | Status: DC | PRN
  Administered 2016-10-17: 30 mg

## 2016-10-17 MED ORDER — SENNA 8.6 MG PO TABS: 2.0000 | ORAL_TABLET | Freq: Every day | ORAL | 0 refills | Status: DC

## 2016-10-17 MED ORDER — METHOCARBAMOL 500 MG PO TABS: 500.0000 mg | ORAL_TABLET | Freq: Four times a day (QID) | ORAL | Status: DC | PRN

## 2016-10-17 MED ORDER — VANCOMYCIN HCL IN DEXTROSE 1-5 GM/200ML-% IV SOLN
1000.0000 mg | Freq: Two times a day (BID) | INTRAVENOUS | Status: AC
  Administered 2016-10-17: 1000 mg via INTRAVENOUS
  Filled 2016-10-17: qty 200

## 2016-10-17 MED ORDER — KETOROLAC TROMETHAMINE 15 MG/ML IJ SOLN
INTRAMUSCULAR | Status: AC
  Filled 2016-10-17: qty 1

## 2016-10-17 MED ORDER — HYDROCHLOROTHIAZIDE 25 MG PO TABS
25.0000 mg | ORAL_TABLET | Freq: Every day | ORAL | Status: DC
  Filled 2016-10-17: qty 1

## 2016-10-17 MED ORDER — PHENYLEPHRINE HCL 10 MG/ML IJ SOLN
INTRAMUSCULAR | Status: AC
  Filled 2016-10-17: qty 1

## 2016-10-17 MED ORDER — METOCLOPRAMIDE HCL 5 MG PO TABS: 5.0000 mg | ORAL_TABLET | Freq: Three times a day (TID) | ORAL | Status: DC | PRN

## 2016-10-17 MED ORDER — LACTATED RINGERS IV SOLN
INTRAVENOUS | Status: DC
  Administered 2016-10-17 (×3): via INTRAVENOUS

## 2016-10-17 MED ORDER — APIXABAN 2.5 MG PO TABS
2.5000 mg | ORAL_TABLET | Freq: Two times a day (BID) | ORAL | Status: DC
  Administered 2016-10-18: 2.5 mg via ORAL
  Filled 2016-10-17: qty 1

## 2016-10-17 MED ORDER — FENTANYL CITRATE (PF) 100 MCG/2ML IJ SOLN
INTRAMUSCULAR | Status: DC | PRN
  Administered 2016-10-17: 50 ug via INTRAVENOUS

## 2016-10-17 MED ORDER — PHENYLEPHRINE 40 MCG/ML (10ML) SYRINGE FOR IV PUSH (FOR BLOOD PRESSURE SUPPORT)
PREFILLED_SYRINGE | INTRAVENOUS | Status: AC
  Filled 2016-10-17: qty 20

## 2016-10-17 MED ORDER — LORAZEPAM 1 MG PO TABS
1.0000 mg | ORAL_TABLET | Freq: Every day | ORAL | Status: DC
  Administered 2016-10-17: 1 mg via ORAL
  Filled 2016-10-17: qty 1

## 2016-10-17 MED ORDER — DOCUSATE SODIUM 100 MG PO CAPS: 100.0000 mg | ORAL_CAPSULE | Freq: Two times a day (BID) | ORAL | 1 refills | Status: DC

## 2016-10-17 MED ORDER — KETOROLAC TROMETHAMINE 15 MG/ML IJ SOLN
7.5000 mg | Freq: Four times a day (QID) | INTRAMUSCULAR | Status: AC
  Administered 2016-10-17 – 2016-10-18 (×4): 7.5 mg via INTRAVENOUS
  Filled 2016-10-17 (×3): qty 1

## 2016-10-17 MED ORDER — PROPOFOL 500 MG/50ML IV EMUL
INTRAVENOUS | Status: DC | PRN
  Administered 2016-10-17: 25 ug/kg/min via INTRAVENOUS

## 2016-10-17 MED ORDER — METOCLOPRAMIDE HCL 5 MG/ML IJ SOLN: 5.0000 mg | Freq: Three times a day (TID) | INTRAMUSCULAR | Status: DC | PRN

## 2016-10-17 MED ORDER — HYDROCODONE-ACETAMINOPHEN 5-325 MG PO TABS
1.0000 | ORAL_TABLET | ORAL | Status: DC | PRN
  Administered 2016-10-17: 1 via ORAL
  Administered 2016-10-18: 2 via ORAL
  Filled 2016-10-17: qty 2
  Filled 2016-10-17: qty 1

## 2016-10-17 MED ORDER — EPHEDRINE 5 MG/ML INJ
INTRAVENOUS | Status: AC
  Filled 2016-10-17: qty 10

## 2016-10-17 MED ORDER — FENTANYL CITRATE (PF) 100 MCG/2ML IJ SOLN
INTRAMUSCULAR | Status: AC
  Filled 2016-10-17: qty 2

## 2016-10-17 MED ORDER — APIXABAN 2.5 MG PO TABS: 2.5000 mg | ORAL_TABLET | Freq: Two times a day (BID) | ORAL | 0 refills | Status: DC

## 2016-10-17 MED ORDER — PROPOFOL 10 MG/ML IV BOLUS
INTRAVENOUS | Status: DC | PRN
  Administered 2016-10-17: 20 mg via INTRAVENOUS

## 2016-10-17 MED ORDER — 0.9 % SODIUM CHLORIDE (POUR BTL) OPTIME
TOPICAL | Status: DC | PRN
  Administered 2016-10-17: 1000 mL

## 2016-10-17 SURGICAL SUPPLY — 51 items
ALCOHOL ISOPROPYL (RUBBING) (MISCELLANEOUS) ×3 IMPLANT
BLADE CLIPPER SURG (BLADE) IMPLANT
CAPT HIP TOTAL 2 ×3 IMPLANT
CHLORAPREP W/TINT 26ML (MISCELLANEOUS) ×3 IMPLANT
COVER SURGICAL LIGHT HANDLE (MISCELLANEOUS) ×3 IMPLANT
DERMABOND ADVANCED (GAUZE/BANDAGES/DRESSINGS) ×4
DERMABOND ADVANCED .7 DNX12 (GAUZE/BANDAGES/DRESSINGS) ×2 IMPLANT
DRAPE C-ARM 42X72 X-RAY (DRAPES) ×3 IMPLANT
DRAPE STERI IOBAN 125X83 (DRAPES) ×3 IMPLANT
DRAPE U-SHAPE 47X51 STRL (DRAPES) ×9 IMPLANT
DRSG AQUACEL AG ADV 3.5X10 (GAUZE/BANDAGES/DRESSINGS) ×3 IMPLANT
ELECT BLADE 4.0 EZ CLEAN MEGAD (MISCELLANEOUS) ×3
ELECT REM PT RETURN 9FT ADLT (ELECTROSURGICAL) ×3
ELECTRODE BLDE 4.0 EZ CLN MEGD (MISCELLANEOUS) ×1 IMPLANT
ELECTRODE REM PT RTRN 9FT ADLT (ELECTROSURGICAL) ×1 IMPLANT
EVACUATOR 1/8 PVC DRAIN (DRAIN) IMPLANT
GLOVE BIO SURGEON STRL SZ8.5 (GLOVE) ×6 IMPLANT
GLOVE BIOGEL PI IND STRL 8.5 (GLOVE) ×1 IMPLANT
GLOVE BIOGEL PI INDICATOR 8.5 (GLOVE) ×2
GOWN STRL REUS W/ TWL LRG LVL3 (GOWN DISPOSABLE) ×2 IMPLANT
GOWN STRL REUS W/TWL 2XL LVL3 (GOWN DISPOSABLE) ×3 IMPLANT
GOWN STRL REUS W/TWL LRG LVL3 (GOWN DISPOSABLE) ×4
HANDPIECE INTERPULSE COAX TIP (DISPOSABLE) ×2
HOOD PEEL AWAY FACE SHEILD DIS (HOOD) ×6 IMPLANT
KIT BASIN OR (CUSTOM PROCEDURE TRAY) ×3 IMPLANT
KIT ROOM TURNOVER OR (KITS) ×3 IMPLANT
MANIFOLD NEPTUNE II (INSTRUMENTS) ×3 IMPLANT
MARKER SKIN DUAL TIP RULER LAB (MISCELLANEOUS) ×6 IMPLANT
NEEDLE SPNL 18GX3.5 QUINCKE PK (NEEDLE) ×6 IMPLANT
NS IRRIG 1000ML POUR BTL (IV SOLUTION) ×3 IMPLANT
PACK TOTAL JOINT (CUSTOM PROCEDURE TRAY) ×3 IMPLANT
PACK UNIVERSAL I (CUSTOM PROCEDURE TRAY) ×3 IMPLANT
PAD ARMBOARD 7.5X6 YLW CONV (MISCELLANEOUS) ×6 IMPLANT
SAW OSC TIP CART 19.5X105X1.3 (SAW) ×3 IMPLANT
SEALER BIPOLAR AQUA 6.0 (INSTRUMENTS) IMPLANT
SET HNDPC FAN SPRY TIP SCT (DISPOSABLE) ×1 IMPLANT
SOLUTION BETADINE 4OZ (MISCELLANEOUS) ×3 IMPLANT
SUT ETHIBOND NAB CT1 #1 30IN (SUTURE) ×6 IMPLANT
SUT MNCRL AB 3-0 PS2 18 (SUTURE) ×3 IMPLANT
SUT MON AB 2-0 CT1 36 (SUTURE) ×3 IMPLANT
SUT VIC AB 1 CT1 27 (SUTURE) ×2
SUT VIC AB 1 CT1 27XBRD ANBCTR (SUTURE) ×1 IMPLANT
SUT VIC AB 2-0 CT1 27 (SUTURE) ×2
SUT VIC AB 2-0 CT1 TAPERPNT 27 (SUTURE) ×1 IMPLANT
SUT VLOC 180 0 24IN GS25 (SUTURE) ×3 IMPLANT
SYR 50ML LL SCALE MARK (SYRINGE) ×3 IMPLANT
TOWEL OR 17X24 6PK STRL BLUE (TOWEL DISPOSABLE) ×3 IMPLANT
TOWEL OR 17X26 10 PK STRL BLUE (TOWEL DISPOSABLE) ×3 IMPLANT
TRAY CATH 16FR W/PLASTIC CATH (SET/KITS/TRAYS/PACK) IMPLANT
TRAY FOLEY CATH 16FR SILVER (SET/KITS/TRAYS/PACK) IMPLANT
WATER STERILE IRR 1000ML POUR (IV SOLUTION) ×9 IMPLANT

## 2016-10-17 NOTE — H&P (View-Only) (Signed)
TOTAL HIP ADMISSION H&P  Patient is admitted for right total hip arthroplasty.  Subjective:  Chief Complaint: right hip pain  HPI: Gwendolyn Bennett, 83 y.o. female, has a history of pain and functional disability in the right hip(s) due to arthritis and patient has failed non-surgical conservative treatments for greater than 12 weeks to include NSAID's and/or analgesics, flexibility and strengthening excercises, use of assistive devices, weight reduction as appropriate and activity modification.  Onset of symptoms was gradual starting 3 years ago with gradually worsening course since that time.The patient noted no past surgery on the right hip(s).  Patient currently rates pain in the right hip at 10 out of 10 with activity. Patient has night pain, worsening of pain with activity and weight bearing, pain that interfers with activities of daily living, pain with passive range of motion and crepitus. Patient has evidence of subchondral cysts, subchondral sclerosis, periarticular osteophytes and joint space narrowing by imaging studies. This condition presents safety issues increasing the risk of falls.  There is no current active infection.  Patient Active Problem List   Diagnosis Date Noted  . Malignant neoplasm of thyroid gland ([place]) 05/02/2016  . Postsurgical hypothyroidism 01/04/2016   Past Medical History:  Diagnosis Date  . Malignant carcinoid tumor ([place])   . Thyroid cancer ([place])   . Zollinger-Ellison syndrome     Past Surgical History:  Procedure Laterality Date  . ABDOMINAL HYSTERECTOMY    . OOPHORECTOMY       (Not in a hospital admission) Allergies  Allergen Reactions  . Amoxicillin-Pot Clavulanate   . Levaquin [Levofloxacin In D5w]   . Nitrofuran Derivatives   . Sulfa Antibiotics     Social History  Substance Use Topics  . Smoking status: Never Smoker  . Smokeless tobacco: Never Used  . Alcohol use No    No family history on file.   Review of Systems  Constitutional:  Negative.   HENT: Positive for tinnitus.   Gastrointestinal: Positive for constipation.  Musculoskeletal: Positive for joint pain.  Skin: Negative.   Neurological: Negative.   Endo/Heme/Allergies: Negative.   Psychiatric/Behavioral: Negative.     Objective:  Physical Exam  Vitals reviewed. Constitutional: She is oriented to person, place, and time. She appears well-developed and well-nourished.  HENT:  Head: Normocephalic and atraumatic.  Eyes: Conjunctivae and EOM are normal.  Neck: Normal range of motion. Neck supple.  Cardiovascular: Normal rate, regular rhythm and intact distal pulses.   Respiratory: Effort normal. No respiratory distress.  GI: Soft. She exhibits no distension.  Genitourinary:  Genitourinary Comments: deferred  Musculoskeletal:       Right hip: She exhibits decreased range of motion.  Neurological: She is alert and oriented to person, place, and time. She has normal reflexes.  Skin: Skin is warm and dry.  Psychiatric: She has a normal mood and affect. Her behavior is normal. Judgment and thought content normal.    Vital signs in last 24 hours: @VSRANGES @  Labs:   Estimated body mass index is 24.28 kg/m as calculated from the following:   Height as of 08/01/16: 5\' 7"  (1.702 m).   Weight as of 08/01/16: 70.3 kg (155 lb).   Imaging Review Plain radiographs demonstrate severe degenerative joint disease of the right hip(s). The bone quality appears to be adequate for age and reported activity level.  Assessment/Plan:  End stage arthritis, right hip(s)  The patient history, physical examination, clinical judgement of the provider and imaging studies are consistent with end stage degenerative joint disease  of the right hip(s) and total hip arthroplasty is deemed medically necessary. The treatment options including medical management, injection therapy, arthroscopy and arthroplasty were discussed at length. The risks and benefits of total hip  arthroplasty were presented and reviewed. The risks due to aseptic loosening, infection, stiffness, dislocation/subluxation,  thromboembolic complications and other imponderables were discussed.  The patient acknowledged the explanation, agreed to proceed with the plan and consent was signed. Patient is being admitted for inpatient treatment for surgery, pain control, PT, OT, prophylactic antibiotics, VTE prophylaxis, progressive ambulation and ADL's and discharge planning.The patient is planning to be discharged home with HEP.

## 2016-10-17 NOTE — Anesthesia Postprocedure Evaluation (Addendum)
Anesthesia Post Note  Patient: Shonia Manger  Procedure(s) Performed: Procedure(s) (LRB): RIGHT TOTAL HIP ARTHROPLASTY ANTERIOR APPROACH (Right)  Patient location during evaluation: PACU Anesthesia Type: Spinal Level of consciousness: oriented and awake and alert Pain management: pain level controlled Vital Signs Assessment: post-procedure vital signs reviewed and stable Respiratory status: spontaneous breathing, respiratory function stable and patient connected to nasal cannula oxygen Cardiovascular status: blood pressure returned to baseline and stable Postop Assessment: no headache and no backache Anesthetic complications: no       Last Vitals:  Vitals:   10/17/16 1429 10/17/16 1444  BP: 127/65 126/82  Pulse: 64 66  Resp: 16 16  Temp:  36.3 C    Last Pain:  Vitals:   10/17/16 1445  PainSc: 0-No pain                 Dywane Peruski,JAMES TERRILL

## 2016-10-17 NOTE — Progress Notes (Signed)
PHARMACIST - PHYSICIAN ORDER COMMUNICATION  CONCERNING: P&T Medication Policy on Herbal Medications  DESCRIPTION:  This patient's order for:  Biotin  has been noted.  This product(s) is classified as an "herbal" or natural product. Due to a lack of definitive safety studies or FDA approval, nonstandard manufacturing practices, plus the potential risk of unknown drug-drug interactions while on inpatient medications, the Pharmacy and Therapeutics Committee does not permit the use of "herbal" or natural products of this type within [facility].   ACTION TAKEN: The pharmacy department is unable to verify this order at this time. Please reevaluate patient's clinical condition at discharge and address if the herbal or natural product(s) should be resumed at that time.   Kelvin Cellar, RPh Pager: [phone] 10/17/2016 3:46 PM

## 2016-10-17 NOTE — Op Note (Signed)
OPERATIVE REPORT  SURGEON: Rod Can, MD   ASSISTANT: Ky Barban, RNFA.  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   PROCEDURE: Right total hip arthroplasty, anterior approach.   IMPLANTS: DePuy Tri Lock stem, size 3, hi offset. DePuy Pinnacle Cup, size 54 mm. DePuy Altrx liner, size 32 by 54 mm, +4 neutral. DePuy Biolox ceramic head ball, size 32 + 1 mm. 6.5 mm cancellous bone screw times one.  ANESTHESIA:  Spinal  ESTIMATED BLOOD LOSS: 450 mL.  ANTIBIOTICS: 1 g vancomycin.  DRAINS: None.  COMPLICATIONS: None.   CONDITION: PACU - hemodynamically stable.  BRIEF CLINICAL NOTE: Jayleana Fidanza is a 83 y.o. female with a long-standing history of Right hip arthritis. After failing conservative management, the patient was indicated for total hip arthroplasty. The risks, benefits, and alternatives to the procedure were explained, and the patient elected to proceed.  PROCEDURE IN DETAIL: Surgical site was marked by myself. Spinal anesthesia was obtained in the pre-op holding area. Once inside the operative room, a foley catheter was inserted. The patient was then positioned on the [place] table. All bony prominences were well padded. The hip was prepped and draped in the normal sterile surgical fashion. A time-out was called verifying side and site of surgery. The patient received IV antibiotics within 60 minutes of beginning the procedure.  The direct anterior approach to the hip was performed through the Hueter interval. Lateral femoral circumflex vessels were treated with the Auqumantys. The anterior capsule was exposed and an inverted T capsulotomy was made.The femoral neck cut was made to the level of the templated cut. A corkscrew was placed into the head and the head was removed. The femoral head was found to have eburnated bone. The head was passed to the  back table and was measured.  Acetabular exposure was achieved, and the pulvinar and labrum were excised. Sequental reaming of the acetabulum was then performed up to a size 53 mm reamer. A 54 mm cup was then opened and impacted into place at approximately 40 degrees of abduction and 20 degrees of anteversion. I elected to place a single 6.5 mm cancellous bone screw to augment the already acceptable press-fit fixation. The final polyethylene liner was impacted into place and acetabular osteophytes were removed.   I then gained femoral exposure taking care to protect the abductors and greater trochanter. This was performed using standard external rotation, extension, and adduction. The capsule was peeled off the inner aspect of the greater trochanter, taking care to preserve the short external rotators. A cookie cutter was used to enter the femoral canal, and then the femoral canal finder was placed. Sequential broaching was performed up to a size 3. Calcar planer was used on the femoral neck remnant. I placed a hi offset neck and a trial head ball. The hip was reduced. Leg lengths and offset were checked fluoroscopically. The hip was dislocated and trial components were removed. The final implants were placed, and the hip was reduced.  Fluoroscopy was used to confirm component position and leg lengths. At 90 degrees of external rotation and full extension, the hip was stable to an anterior directed force.  The wound was copiously irrigated with a dilute betadine solution followed by normal saline. Marcaine solution was injected into the periarticular soft tissue. The wound was closed in layers using #1 Vicryl and V-Loc for the fascia, 2-0 Vicryl for the subcutaneous fat, 2-0 Monocryl for the  deep dermal layer, 3-0 running Monocryl subcuticular stitch, and Dermabond for the skin. Once the glue was fully dried, an Aquacell Ag dressing was applied. The patient was transported to the recovery room  in stable condition. Sponge, needle, and instrument counts were correct at the end of the case x2. The patient tolerated the procedure well and there were no known complications.

## 2016-10-17 NOTE — Transfer of Care (Signed)
Immediate Anesthesia Transfer of Care Note  Patient: Gwendolyn Bennett  Procedure(s) Performed: Procedure(s): RIGHT TOTAL HIP ARTHROPLASTY ANTERIOR APPROACH (Right)  Patient Location: PACU  Anesthesia Type:MAC and Spinal  Level of Consciousness: awake, alert  and oriented  Airway & Oxygen Therapy: Patient Spontanous Breathing  Post-op Assessment: Report given to RN and Post -op Vital signs reviewed and stable  Post vital signs: Reviewed and stable  Last Vitals:  Vitals:   10/17/16 0851  BP: (!) 162/71  Pulse: 71  Resp: 20  Temp: 36.8 C    Last Pain: There were no vitals filed for this visit.    Patients Stated Pain Goal: 2 (96/78/93 8101)  Complications: No apparent anesthesia complications

## 2016-10-17 NOTE — Interval H&P Note (Signed)
History and Physical Interval Note:  10/17/2016 9:32 AM  Gwendolyn Bennett  has presented today for surgery, with the diagnosis of Degenerative joint disease Right hip  The various methods of treatment have been discussed with the patient and family. After consideration of risks, benefits and other options for treatment, the patient has consented to  Procedure(s): RIGHT TOTAL HIP ARTHROPLASTY ANTERIOR APPROACH (Right) as a surgical intervention .  The patient's history has been reviewed, patient examined, no change in status, stable for surgery.  I have reviewed the patient's chart and labs.  Questions were answered to the patient's satisfaction.     The risks, benefits, and alternatives were discussed with the patient. There are risks associated with the surgery including, but not limited to, problems with anesthesia (death), infection, instability (giving out of the joint), dislocation, differences in leg length/angulation/rotation, fracture of bones, loosening or failure of implants, hematoma (blood accumulation) which may require surgical drainage, blood clots, pulmonary embolism, nerve injury (foot drop and lateral thigh numbness), and blood vessel injury. The patient understands these risks and elects to proceed.   Gwendolyn Bennett, Horald Pollen

## 2016-10-17 NOTE — Discharge Summary (Signed)
Physician Discharge Summary  Patient ID: Laketa Cangemi MRN: [id number] DOB/AGE: 1927-11-22 83 y.o.  Admit date: 10/17/2016 Discharge date: 10/18/2016  Admission Diagnoses:  Primary osteoarthritis of right hip  Discharge Diagnoses:  Principal Problem:   Primary osteoarthritis of right hip Active Problems:   Osteoarthritis of right hip   Past Medical History:  Diagnosis Date  . Arthritis   . Depression   . Dysrhythmia   . GERD (gastroesophageal reflux disease)   . Hypertension   . Hypothyroidism   . 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 Thyroid cancer ([place])   . Zollinger-Ellison syndrome     Surgeries: Procedure(s): RIGHT TOTAL HIP ARTHROPLASTY ANTERIOR APPROACH on 10/17/2016   Consultants (if any):   Discharged Condition: Improved  Hospital Course: Teea Embler is an 83 y.o. female who was admitted 10/17/2016 with a diagnosis of Primary osteoarthritis of right hip and went to the operating room on 10/17/2016 and underwent the above named procedures.    She was given perioperative antibiotics:  Anti-infectives    Start     Dose/Rate Route Frequency Ordered Stop   10/17/16 2200  vancomycin (VANCOCIN) IVPB 1000 mg/200 mL premix     1,000 mg 200 mL/hr over 60 Minutes Intravenous Every 12 hours 10/17/16 1520 10/17/16 2328   10/17/16 1030  ceFAZolin (ANCEF) IVPB 2g/100 mL premix     2 g 200 mL/hr over 30 Minutes Intravenous To [facility]Stay [facility] 10/14/16 1053 10/17/16 1056    .  She was given sequential compression devices, early ambulation, and apxaban for DVT prophylaxis.  She benefited maximally from the hospital stay and there were no complications.    Recent vital signs:  Vitals:   10/18/16 0000 10/18/16 0421  BP: (!) 101/58 (!) 107/43  Pulse: 78 79  Resp: 16 16  Temp: 98.4 F (36.9 C) 98.8 F (37.1 C)    Recent laboratory studies:  Lab Results  Component Value Date   HGB 8.8 (L) 10/18/2016   HGB 13.6 10/10/2016   HGB 13.4  04/21/2012   Lab Results  Component Value Date   WBC 9.9 10/18/2016   PLT 205 10/18/2016   No results found for: INR Lab Results  Component Value Date   NA 130 (L) 10/18/2016   K 3.0 (L) 10/18/2016   CL 96 (L) 10/18/2016   CO2 26 10/18/2016   BUN 9 10/18/2016   CREATININE 0.56 10/18/2016   GLUCOSE 99 10/18/2016    Discharge Medications:   Allergies as of 10/18/2016      Reactions   Amoxicillin-pot Clavulanate Other (See Comments)   UNSPECIFIED REACTION  Has patient had a PCN reaction causing immediate rash, facial/tongue/throat swelling, SOB or lightheadedness with hypotension: UNKNOWN Has patient had a PCN reaction causing severe rash involving mucus membranes or skin necrosis: UNKNOWN Has patient had a PCN reaction that required hospitalization UNKNOWN Has patient had a PCN reaction occurring within the last 10 years: No If all of the above answers are "NO", then may proceed with Cephalosporin use. Unknown   Levaquin [levofloxacin In D5w] Other (See Comments)   UNSPECIFIED REACTION    Codeine Nausea Only   Dexilant [dexlansoprazole] Itching, Nausea And Vomiting   Nitrofuran Derivatives Nausea And Vomiting   Sulfa Antibiotics Rash      Medication List    STOP taking these medications   acetaminophen 500 MG tablet Commonly known as:  TYLENOL   ibuprofen 200 MG tablet Commonly known as:  ADVIL,MOTRIN  TAKE these medications   apixaban 2.5 MG Tabs tablet Commonly known as:  ELIQUIS Take 1 tablet (2.5 mg total) by mouth every 12 (twelve) hours.   atenolol 50 MG tablet Commonly known as:  TENORMIN Take 50 mg by mouth daily.   BIOTIN 5000 PO Take 1 tablet by mouth daily.   calcitRIOL 0.25 MCG capsule Commonly known as:  ROCALTROL Take 0.25 mcg by mouth daily.   CITRACAL + D PO Take 1 tablet by mouth 2 (two) times daily.   clidinium-chlordiazePOXIDE 5-2.5 MG capsule Commonly known as:  LIBRAX Take 1 capsule by mouth 3 (three) times daily as needed.    docusate sodium 100 MG capsule Commonly known as:  COLACE Take 1 capsule (100 mg total) by mouth 2 (two) times daily.   esomeprazole 40 MG capsule Commonly known as:  NEXIUM Take 40 mg by mouth 2 (two) times daily before a meal.   FLUoxetine 20 MG capsule Commonly known as:  PROZAC Take 20 mg by mouth daily.   gabapentin 100 MG capsule Commonly known as:  NEURONTIN Take 100 mg by mouth 3 (three) times daily.   hydrochlorothiazide 25 MG tablet Commonly known as:  HYDRODIURIL Take 25 mg by mouth daily.   HYDROcodone-acetaminophen 5-325 MG tablet Commonly known as:  NORCO/VICODIN Take 1-2 tablets by mouth every 4 (four) hours as needed (breakthrough pain).   levothyroxine 100 MCG tablet Commonly known as:  SYNTHROID, LEVOTHROID Take 1 tablet (100 mcg total) by mouth daily before breakfast.   LORazepam 1 MG tablet Commonly known as:  ATIVAN Take 1 mg by mouth at bedtime.   ondansetron 4 MG tablet Commonly known as:  ZOFRAN Take 1 tablet (4 mg total) by mouth every 6 (six) hours as needed for nausea.   senna 8.6 MG Tabs tablet Commonly known as:  SENOKOT Take 2 tablets (17.2 mg total) by mouth at bedtime.       Diagnostic Studies: Dg Pelvis Portable  Result Date: 10/17/2016 CLINICAL DATA:  Status post right hip arthroplasty EXAM: PORTABLE PELVIS 1-2 VIEWS COMPARISON:  None. FINDINGS: Right hip replacement is noted in satisfactory position. No acute bony or soft tissue abnormality is noted. IMPRESSION: Status post right hip arthroplasty Electronically Signed   By: Inez Catalina M.D.   On: 10/17/2016 16:15   Dg C-arm 1-60 Min  Result Date: 10/17/2016 CLINICAL DATA:  Hip replacement . EXAM: OPERATIVE RIGHT HIP (WITH PELVIS IF PERFORMED) 2 VIEWS TECHNIQUE: Fluoroscopic spot image(s) were submitted for interpretation post-operatively. COMPARISON:  No recent prior . FINDINGS: Total right hip replacement. Hardware intact. No acute bony abnormality. IMPRESSION: Total right hip  replacement.  Hardware intact.  Anatomic alignment . Electronically Signed   By: Marcello Moores  Register   On: 10/17/2016 12:57   Dg Hip Operative Unilat With Pelvis Right  Result Date: 10/17/2016 CLINICAL DATA:  Hip replacement . EXAM: OPERATIVE RIGHT HIP (WITH PELVIS IF PERFORMED) 2 VIEWS TECHNIQUE: Fluoroscopic spot image(s) were submitted for interpretation post-operatively. COMPARISON:  No recent prior . FINDINGS: Total right hip replacement. Hardware intact. No acute bony abnormality. IMPRESSION: Total right hip replacement.  Hardware intact.  Anatomic alignment . Electronically Signed   By: Wilson   On: 10/17/2016 12:57    Disposition: Final discharge disposition not confirmed  Discharge Instructions    Call MD / Call 911    Complete by:  As directed    If you experience chest pain or shortness of breath, CALL 911 and be transported to the hospital  emergency room.  If you develope a fever above 101 F, pus (white drainage) or increased drainage or redness at the wound, or calf pain, call your surgeon's office.   Constipation Prevention    Complete by:  As directed    Drink plenty of fluids.  Prune juice may be helpful.  You may use a stool softener, such as Colace (over the counter) 100 mg twice a day.  Use MiraLax (over the counter) for constipation as needed.   Diet - low sodium heart healthy    Complete by:  As directed    Driving restrictions    Complete by:  As directed    No driving for 6 weeks   Increase activity slowly as tolerated    Complete by:  As directed    Lifting restrictions    Complete by:  As directed    No lifting for 6 weeks   TED hose    Complete by:  As directed    Use stockings (TED hose) for 2 weeks on both leg(s).  You may remove them at night for sleeping.      Follow-up Information    Saron Tweed, Horald Pollen, MD. Schedule an appointment as soon as possible for a visit in 2 weeks.   Specialty:  Orthopedic Surgery Why:  For wound re-check Contact  information: [address] [address] [phone]            Signed: Elie Goody 10/18/2016, 7:55 AM

## 2016-10-17 NOTE — Anesthesia Preprocedure Evaluation (Addendum)
Anesthesia Evaluation  Patient identified by MRN, date of birth, ID band Patient awake    Reviewed: Allergy & Precautions, NPO status , Patient's Chart, lab work & pertinent test results  History of Anesthesia Complications Negative for: history of anesthetic complications  Airway Mallampati: II  TM Distance: >3 FB Neck ROM: Full    Dental  (+) Teeth Intact, Dental Advisory Given   Pulmonary neg pulmonary ROS,    breath sounds clear to auscultation       Cardiovascular hypertension, + dysrhythmias  Rhythm:Regular Rate:Normal     Neuro/Psych negative neurological ROS     GI/Hepatic PUD, GERD  Medicated,  Endo/Other  Hypothyroidism   Renal/GU      Musculoskeletal  (+) Arthritis ,   Abdominal   Peds  Hematology   Anesthesia Other Findings   Reproductive/Obstetrics                          Anesthesia Physical Anesthesia Plan  ASA: III  Anesthesia Plan: Spinal   Post-op Pain Management:    Induction: Intravenous  Airway Management Planned: Simple Face Mask and Natural Airway  Additional Equipment:   Intra-op Plan:   Post-operative Plan:   Informed Consent: I have reviewed the patients History and Physical, chart, labs and discussed the procedure including the risks, benefits and alternatives for the proposed anesthesia with the patient or authorized representative who has indicated his/her understanding and acceptance.     Plan Discussed with: CRNA  Anesthesia Plan Comments:         Anesthesia Quick Evaluation

## 2016-10-18 ENCOUNTER — Encounter (HOSPITAL_COMMUNITY): Payer: Self-pay | Admitting: Orthopedic Surgery

## 2016-10-18 LAB — BASIC METABOLIC PANEL
Anion gap: 8 (ref 5–15)
BUN: 9 mg/dL (ref 6–20)
CALCIUM: 7.2 mg/dL — AB (ref 8.9–10.3)
CO2: 26 mmol/L (ref 22–32)
Chloride: 96 mmol/L — ABNORMAL LOW (ref 101–111)
Creatinine, Ser: 0.56 mg/dL (ref 0.44–1.00)
GFR calc Af Amer: >60 mL/min (ref >60)
Glucose, Bld: 99 mg/dL (ref 65–99)
Potassium: 3 mmol/L — ABNORMAL LOW (ref 3.5–5.1)
SODIUM: 130 mmol/L — AB (ref 135–145)

## 2016-10-18 LAB — CBC
HCT: 26.3 % — ABNORMAL LOW (ref 36.0–46.0)
Hemoglobin: 8.8 g/dL — ABNORMAL LOW (ref 12.0–15.0)
MCH: 30.4 pg (ref 26.0–34.0)
MCHC: 33.5 g/dL (ref 30.0–36.0)
MCV: 91 fL (ref 78.0–100.0)
Platelets: 205 10*3/uL (ref 150–400)
RBC: 2.89 MIL/uL — AB (ref 3.87–5.11)
RDW: 13.3 % (ref 11.5–15.5)
WBC: 9.9 10*3/uL (ref 4.0–10.5)

## 2016-10-18 NOTE — Progress Notes (Signed)
Occupational Therapy Evaluation Patient Details Name: Gwendolyn Bennett MRN: [id number] DOB: 1928/04/01 Today's Date: 10/18/2016    History of Present Illness 83 yo s/p T ant THA   Clinical Impression   PTA, pt lived alone and was independent with ADL and occasionally used an AD for ambulation as needed due to pain. Began education regarding compensatory techniques for ADL and safe mobility for toilet/tub transfers. Pt interested in use of AE for LB ADL as LB ADL were a struggle for her PTA. Will return for a second visit to complete education and review information per pt request to facilitate safe DC home with initial 24/7 S that family will provide. Pt making excellent progress and ambulating with minguard A @ RW level. Minimal complaints of pain. Pt very appreciative.     Follow Up Recommendations  No OT follow up;Supervision/Assistance - 24 hour (initially)    Equipment Recommendations  None recommended by OT    Recommendations for Other Services       Precautions / Restrictions Precautions Precautions: Fall Restrictions Weight Bearing Restrictions: Yes      Mobility Bed Mobility Overal bed mobility: Needs Assistance Bed Mobility: Supine to Sit     Supine to sit: Min assist     General bed mobility comments: Educated pt how to use LLE to advance RLE off bed. Use of rails.  Transfers Overall transfer level: Needs assistance Equipment used: Standard walker Transfers: Sit to/from [employer] Transfers Sit to Stand: Min guard Stand pivot transfers: Min guard       General transfer comment: Educated pt on sequence of using RW due to having difficulty with "heavy" RLE. vc for safety    Balance Overall balance assessment: Needs assistance   Sitting balance-Leahy Scale: Good       Standing balance-Leahy Scale: Fair Standing balance comment: able to release RW for ADL                            ADL Overall ADL's : Needs assistance/impaired      Grooming: Set up   Upper Body Bathing: Set up;Sitting   Lower Body Bathing: Minimal assistance;Sit to/from stand   Upper Body Dressing : Set up;Sitting   Lower Body Dressing: Minimal assistance;Sit to/from stand Lower Body Dressing Details (indicate cue type and reason): states LB ADL were difficult prior to surgery Toilet Transfer: Min guard;Ambulation;RW   Toileting- Clothing Manipulation and Hygiene: Supervision/safety;Sit to/from stand       Functional mobility during ADLs: Min guard;Rolling walker;Cueing for sequencing General ADL Comments: Pt interested in learning about AD for ADL     Vision         Perception     Praxis      Pertinent Vitals/Pain Pain Assessment: Faces Faces Pain Scale: Hurts a little bit Pain Location: R hip Pain Descriptors / Indicators: Discomfort Pain Intervention(s): Limited activity within patient's tolerance;Repositioned;Ice applied     Hand Dominance Right   Extremity/Trunk Assessment Upper Extremity Assessment Upper Extremity Assessment: Overall WFL for tasks assessed   Lower Extremity Assessment Lower Extremity Assessment: Defer to PT evaluation (States RLE is "heavy")   Cervical / Trunk Assessment Cervical / Trunk Assessment: Normal   Communication     Cognition Arousal/Alertness: Awake/alert Behavior During Therapy: WFL for tasks assessed/performed Overall Cognitive Status: Within Functional Limits for tasks assessed                     General Comments  Exercises       Shoulder Instructions      Home Living Family/patient expects to be discharged to:: Private residence   Available Help at Discharge: Family;Friend(s);Available 24 hours/day Type of Home: House Home Access: Stairs to enter [employer] of Steps: 1 Entrance Stairs-Rails:  (Has post) Home Layout: One level     Bathroom Shower/Tub: Tub/shower unit;Curtain Shower/tub characteristics: Architectural technologist: Handicapped  height Bathroom Accessibility: Yes How Accessible: Accessible via walker Home Equipment: [place] - 2 wheels;Walker - 4 wheels;Bedside commode;Shower seat;Tub bench;Adaptive equipment Adaptive Equipment: Reacher        Prior Functioning/Environment Level of Independence: Independent        Comments: used rollator or furniture walker when needed due to hip pain        OT Problem List: Decreased strength;Decreased range of motion;Decreased activity tolerance;Decreased safety awareness;Decreased knowledge of use of DME or AE;Obesity;Pain      OT Treatment/Interventions: Self-care/ADL training;DME and/or AE instruction;Therapeutic activities;Patient/family education    OT Goals(Current goals can be found in the care plan section) Acute Rehab OT Goals Patient Stated Goal: to be able to do things again OT Goal Formulation: With patient Time For Goal Achievement: 10/25/16 Potential to Achieve Goals: Good ADL Goals Additional ADL Goal #1: Pt will demonstrate understanding of use of AE for LB ADL with min vc  OT Frequency: Min 2X/week   Barriers to D/C:            Co-evaluation              End of Session Equipment Utilized During Treatment: Gait belt;Rolling walker Nurse Communication: Mobility status;Precautions;Weight bearing status  Activity Tolerance: Patient tolerated treatment well Patient left: in chair;with call bell/phone within reach;with family/visitor present  OT Visit Diagnosis: Other abnormalities of gait and mobility (R26.89)                ADL either performed or assessed with clinical judgement  Time: 0911-0945 OT Time Calculation (min): 34 min Charges:  OT General Charges $OT Visit: 1 Procedure OT Evaluation $OT Eval Low Complexity: 1 Procedure OT Treatments $Self Care/Home Management : 8-22 mins G-Codes:     [facility], OT/L  [id number] 10/18/2016  Kiandria Clum,HILLARY 10/18/2016, 10:01 AM

## 2016-10-18 NOTE — Progress Notes (Signed)
PT Progress Note  Patient is making good progress with PT.  From a mobility standpoint anticipate patient will be ready for DC home with family and friends to assist. PT denies any questions or concerns following PT session.     10/18/16 1452  PT Visit Information  Last PT Received On 10/18/16  Assistance Needed +1  History of Present Illness 83 yo s/p T ant THA  Subjective Data  Subjective Ready to get home.   Patient Stated Goal get back to being independent  Precautions  Precautions Fall  Restrictions  Weight Bearing Restrictions Yes  RLE Weight Bearing WBAT  Pain Assessment  Pain Assessment Faces  Faces Pain Scale 2  Pain Location Rt hip  Pain Descriptors / Indicators Sore  Pain Intervention(s) Limited activity within patient's tolerance;Monitored during session  Cognition  Arousal/Alertness Awake/alert  Behavior During Therapy [facility] for tasks assessed/performed  Overall Cognitive Status Within Functional Limits for tasks assessed  Bed Mobility  General bed mobility comments in chair upon arrival  Transfers  Overall transfer level Needs assistance  Equipment used Rolling walker (2 wheeled)  Transfers Sit to/from Stand  Sit to Stand Supervision  Balance  Overall balance assessment Needs assistance  Sitting-balance support No upper extremity supported  Sitting balance-Leahy Scale Good  Standing balance support Bilateral upper extremity supported  Standing balance-Leahy Scale Poor  Standing balance comment using rw for support  Exercises  Exercises Total Joint  Total Joint Exercises  Ankle Circles/Pumps AROM;Both;10 reps  [employer] Strengthening;Right;10 reps  Short Arc Quad Strengthening;Right;10 reps  Heel Slides AAROM;Right;10 reps  Hip ABduction/ADduction Strengthening;Right;10 reps  Long Arc Quad Strengthening;Right;10 reps  PT - End of Session  Equipment Utilized During Treatment Gait belt  Activity Tolerance Patient tolerated treatment well  Patient left in  chair;with call bell/phone within reach;with family/visitor present  Nurse Communication Mobility status  PT - Assessment/Plan  PT Plan Current plan remains appropriate  PT Visit Diagnosis Unsteadiness on feet (R26.81);Muscle weakness (generalized) (M62.81)  PT Frequency (ACUTE ONLY) 7X/week  Follow Up Recommendations Home health PT;Supervision for mobility/OOB  PT equipment None recommended by PT  AM-PAC PT "6 Clicks" Daily Activity Outcome Measure  Difficulty turning over in bed (including adjusting bedclothes, sheets and blankets)? 3  Difficulty moving from lying on back to sitting on the side of the bed?  3  Difficulty sitting down on and standing up from a chair with arms (e.g., wheelchair, bedside commode, etc,.)? 4  Help needed moving to and from a bed to chair (including a wheelchair)? 3  Help needed walking in hospital room? 3  Help needed climbing 3-5 steps with a railing?  3  6 Click Score 19  Mobility G Code  CJ  PT Goal Progression  Progress towards PT goals Progressing toward goals  Acute Rehab PT Goals  PT Goal Formulation With patient  Time For Goal Achievement 11/01/16  Potential to Achieve Goals Good  PT Time Calculation  PT Start Time (ACUTE ONLY) 1431  PT Stop Time (ACUTE ONLY) 1450  PT Time Calculation (min) (ACUTE ONLY) 19 min  PT General Charges  $$ ACUTE PT VISIT 1 Procedure  PT Treatments  $Therapeutic Exercise 8-22 mins  Cassell Clement, PT, CSCS Pager [phone] Office [phone]

## 2016-10-18 NOTE — Evaluation (Signed)
Physical Therapy Evaluation Patient Details Name: Gwendolyn Bennett MRN: [id number] DOB: [birth year] Today's Date: 10/18/2016   History of Present Illness  83 yo s/p T ant THA  Clinical Impression  Pt is s/p THA resulting in the deficits listed below (see PT Problem List). Pt mobilizing well during initial PT session, anticipate DC to home with friends and family to assist. Pt reports agreement with plan. Pt will benefit from skilled PT to increase their independence and safety with mobility to allow discharge to home.      Follow Up Recommendations Home health PT;Supervision for mobility/OOB    Equipment Recommendations  None recommended by PT    Recommendations for Other Services       Precautions / Restrictions Precautions Precautions: Fall Restrictions Weight Bearing Restrictions: Yes RLE Weight Bearing: Weight bearing as tolerated      Mobility  Bed Mobility Overal bed mobility: Needs Assistance Bed Mobility: Sit to Supine       Sit to supine: Mod assist   General bed mobility comments: assist needed with LEs  Transfers Overall transfer level: Needs assistance Equipment used: Rolling walker (2 wheeled) Transfers: Sit to/from Stand Sit to Stand: Min guard         General transfer comment: reminder for hand placement. Performed from [facility]  Ambulation/Gait Ambulation/Gait assistance: Min guard Ambulation Distance (Feet): 110 Feet Assistive device: Rolling walker (2 wheeled) Gait Pattern/deviations: Step-to pattern;Step-through pattern Gait velocity: decreased   General Gait Details: progressing to step through pattern  Stairs Stairs: Yes Stairs assistance: Min guard Stair Management: One rail Left (reports having rail on Lt) Number of Stairs:  (1 step X5) General stair comments: good stability, cues for sequence initially  Wheelchair Mobility    Modified Rankin (Stroke Patients Only)       Balance Overall balance assessment: Needs  assistance Sitting-balance support: No upper extremity supported Sitting balance-Leahy Scale: Good     Standing balance support: Bilateral upper extremity supported Standing balance-Leahy Scale: Poor Standing balance comment: using rw for support                             Pertinent Vitals/Pain Pain Assessment: 0-10 Pain Score: 2  Pain Location: R hip Pain Descriptors / Indicators: Sore Pain Intervention(s): Limited activity within patient's tolerance;Monitored during session    Home Living Family/patient expects to be discharged to:: Private residence Living Arrangements: Alone Available Help at Discharge: Family;Friend(s);Available 24 hours/day Type of Home: House Home Access: Stairs to enter Entrance Stairs-Rails: Left Entrance Stairs-Number of Steps: 1 Home Layout: One level Home Equipment: Walker - 2 wheels;Walker - 4 wheels      Prior Function Level of Independence: Independent         Comments: used rollator or furniture walker when needed due to hip pain     Hand Dominance        Extremity/Trunk Assessment   Upper Extremity Assessment Upper Extremity Assessment: Defer to OT evaluation    Lower Extremity Assessment Lower Extremity Assessment: RLE deficits/detail RLE Deficits / Details: fair quad activation       Communication   Communication: No difficulties  Cognition Arousal/Alertness: Awake/alert Behavior During Therapy: WFL for tasks assessed/performed Overall Cognitive Status: Within Functional Limits for tasks assessed                      General Comments      Exercises     Assessment/Plan    PT  Assessment Patient needs continued PT services  PT Problem List Decreased strength;Decreased range of motion;Decreased activity tolerance;Decreased balance;Decreased mobility       PT Treatment Interventions DME instruction;Gait training;Stair training;Functional mobility training;Therapeutic activities;Therapeutic  exercise;Patient/family education    PT Goals (Current goals can be found in the Care Plan section)  Acute Rehab PT Goals Patient Stated Goal: get back to being independent PT Goal Formulation: With patient Time For Goal Achievement: 11/01/16 Potential to Achieve Goals: Good    Frequency 7X/week   Barriers to discharge        Co-evaluation               End of Session Equipment Utilized During Treatment: Gait belt Activity Tolerance: Patient tolerated treatment well Patient left: in bed;with call bell/phone within reach;with family/visitor present Nurse Communication: Mobility status PT Visit Diagnosis: Unsteadiness on feet (R26.81);Muscle weakness (generalized) (M62.81)    Functional Assessment Tool Used: AM-PAC 6 Clicks Basic Mobility;Clinical judgement Functional Limitation: Mobility: Walking and moving around Mobility: Walking and Moving Around Current Status [id number]): At least 40 percent but less than 60 percent impaired, limited or restricted Mobility: Walking and Moving Around Goal Status [phone]): At least 20 percent but less than 40 percent impaired, limited or restricted    Time: 1026-1055 PT Time Calculation (min) (ACUTE ONLY): 29 min   Charges:   PT Evaluation $PT Eval Moderate Complexity: 1 Procedure PT Treatments $Gait Training: 8-22 mins   PT G Codes:   PT G-Codes **NOT FOR INPATIENT CLASS** Functional Assessment Tool Used: AM-PAC 6 Clicks Basic Mobility;Clinical judgement Functional Limitation: Mobility: Walking and moving around Mobility: Walking and Moving Around Current Status [id number]): At least 40 percent but less than 60 percent impaired, limited or restricted Mobility: Walking and Moving Around Goal Status [phone]): At least 20 percent but less than 40 percent impaired, limited or restricted     Cassell Clement, PT, CSCS Pager [phone] Office [phone]  10/18/2016, 1:47 PM

## 2016-10-18 NOTE — Progress Notes (Signed)
   Subjective:  Patient reports pain as mild to moderate.  No C/o. Denies N/v/CP/SOB.  Objective:   VITALS:   Vitals:   10/17/16 1520 10/17/16 2158 10/18/16 0000 10/18/16 0421  BP: 138/60 (!) 111/59 (!) 101/58 (!) 107/43  Pulse: 77 73 78 79  Resp:  18 16 16   Temp:  98.5 F (36.9 C) 98.4 F (36.9 C) 98.8 F (37.1 C)  TempSrc:  Oral Oral Oral  SpO2: 99% 95% 98% 95%  Weight:        NAD Sitting up eating breakfast ABD soft Sensation intact distally Intact pulses distally Dorsiflexion/Plantar flexion intact Incision: dressing C/D/I Compartment soft   Lab Results  Component Value Date   WBC 9.9 10/18/2016   HGB 8.8 (L) 10/18/2016   HCT 26.3 (L) 10/18/2016   MCV 91.0 10/18/2016   PLT 205 10/18/2016   BMET    Component Value Date/Time   NA 130 (L) 10/18/2016 0528   NA 135 (L) 04/21/2012 2006   K 3.0 (L) 10/18/2016 0528   K 2.9 (L) 04/21/2012 2006   CL 96 (L) 10/18/2016 0528   CL 99 04/21/2012 2006   CO2 26 10/18/2016 0528   CO2 27 04/21/2012 2006   GLUCOSE 99 10/18/2016 0528   GLUCOSE 151 (H) 04/21/2012 2006   BUN 9 10/18/2016 0528   BUN 15 04/21/2012 2006   CREATININE 0.56 10/18/2016 0528   CREATININE 0.83 04/21/2012 2006   CALCIUM 7.2 (L) 10/18/2016 0528   CALCIUM 8.2 (L) 04/21/2012 2006   GFRNONAA >60 10/18/2016 0528   GFRNONAA >60 04/21/2012 2006   GFRAA >60 10/18/2016 0528   GFRAA >60 04/21/2012 2006     Assessment/Plan: 1 Day Post-Op   Principal Problem:   Primary osteoarthritis of right hip Active Problems:   Osteoarthritis of right hip   WBAT with walker Apixaban, SCDs, TEDs PT/OT PO pain control Dispo: D/C home with HEP   Yuval Nolet, Horald Pollen 10/18/2016, 7:52 AM   Rod Can, MD Cell [phone]

## 2016-10-18 NOTE — Care Management Note (Signed)
Case Management Note  Patient Details  Name: Gwendolyn Bennett MRN: [id number] Date of Birth: [birth year]  Subjective/Objective:  83 yr old female s/p right total hip arthroplasty- anterior approach.                   Action/Plan: referral for Home health was called to Stevie Kern, [place] [facility] Liaison. Patient will have family support at discharge.    Expected Discharge Date:  10/18/16               Expected Discharge Plan:  [place]  In-House Referral:  NA  Discharge planning Services  CM Consult  Post Acute Care Choice:  Durable Medical Equipment, Home Health Choice offered to:  Patient  DME Arranged:  3-N-1, Walker rolling DME Agency:  [place]:  [facility] Agency:  [place]  Status of Service:  Completed, signed off  If discussed at [place] of Stay Meetings, dates discussed:    Additional Comments:  Ninfa Meeker, RN 10/18/2016, 12:59 PM

## 2016-10-18 NOTE — Discharge Instructions (Signed)
°Dr. Brian Swinteck °Joint Replacement Specialist °[facility] °[address] °[place],  [address] °[phone] ° ° °TOTAL HIP REPLACEMENT POSTOPERATIVE DIRECTIONS ° ° ° °Hip Rehabilitation, Guidelines Following Surgery  ° °WEIGHT BEARING °Weight bearing as tolerated with assist device (walker, cane, etc) as directed, use it as long as suggested by your surgeon or therapist, typically at least 4-6 weeks. ° °The results of a hip operation are greatly improved after range of motion and muscle strengthening exercises. Follow all safety measures which are given to protect your hip. If any of these exercises cause increased pain or swelling in your joint, decrease the amount until you are comfortable again. Then slowly increase the exercises. Call your caregiver if you have problems or questions.  ° °HOME CARE INSTRUCTIONS  °Most of the following instructions are designed to prevent the dislocation of your new hip.  °Remove items at home which could result in a fall. This includes throw rugs or furniture in walking pathways.  °Continue medications as instructed at time of discharge. °· You may have some home medications which will be placed on hold until you complete the course of blood thinner medication. °· You may start showering once you are discharged home. Do not remove your dressing. °Do not put on socks or shoes without following the instructions of your caregivers.   °Sit on chairs with arms. Use the chair arms to help push yourself up when arising.  °Arrange for the use of a toilet seat elevator so you are not sitting low.  °· Walk with walker as instructed.  °You may resume a sexual relationship in one month or when given the OK by your caregiver.  °Use walker as long as suggested by your caregivers.  °You may put full weight on your legs and walk as much as is comfortable. °Avoid periods of inactivity such as sitting longer than an hour when not asleep. This helps prevent  blood clots.  °You may return to work once you are cleared by your surgeon.  °Do not drive a car for 6 weeks or until released by your surgeon.  °Do not drive while taking narcotics.  °Wear elastic stockings for two weeks following surgery during the day but you may remove then at night.  °Make sure you keep all of your appointments after your operation with all of your doctors and caregivers. You should call the office at the above phone number and make an appointment for approximately two weeks after the date of your surgery. °Please pick up a stool softener and laxative for home use as long as you are requiring pain medications. °· ICE to the affected hip every three hours for 30 minutes at a time and then as needed for pain and swelling. Continue to use ice on the hip for pain and swelling from surgery. You may notice swelling that will progress down to the foot and ankle.  This is normal after surgery.  Elevate the leg when you are not up walking on it.   °It is important for you to complete the blood thinner medication as prescribed by your doctor. °· Continue to use the breathing machine which will help keep your temperature down.  It is common for your temperature to cycle up and down following surgery, especially at night when you are not up moving around and exerting yourself.  The breathing machine keeps your lungs expanded and your temperature down. ° °RANGE OF MOTION AND STRENGTHENING EXERCISES  °These exercises are   are designed to help you keep full movement of your hip joint. Follow your caregiver's or physical therapist's instructions. Perform all exercises about fifteen times, three times per day or as directed. Exercise both hips, even if you have had only one joint replacement. These exercises can be done on a training (exercise) mat, on the floor, on a table or on a bed. Use whatever works the best and is most comfortable for you. Use music or television while you are exercising so that the exercises  are a pleasant break in your day. This will make your life better with the exercises acting as a break in routine you can look forward to.  °Lying on your back, slowly slide your foot toward your buttocks, raising your knee up off the floor. Then slowly slide your foot back down until your leg is straight again.  °Lying on your back spread your legs as far apart as you can without causing discomfort.  °Lying on your side, raise your upper leg and foot straight up from the floor as far as is comfortable. Slowly lower the leg and repeat.  °Lying on your back, tighten up the muscle in the front of your thigh (quadriceps muscles). You can do this by keeping your leg straight and trying to raise your heel off the floor. This helps strengthen the largest muscle supporting your knee.  °Lying on your back, tighten up the muscles of your buttocks both with the legs straight and with the knee bent at a comfortable angle while keeping your heel on the floor.  ° °SKILLED REHAB INSTRUCTIONS: °If the patient is transferred to a skilled rehab facility following release from the hospital, a list of the current medications will be sent to the facility for the patient to continue.  When discharged from the skilled rehab facility, please have the facility set up the patient's Home Health Physical Therapy prior to being released. Also, the skilled facility will be responsible for providing the patient with their medications at time of release from the facility to include their pain medication and their blood thinner medication. If the patient is still at the rehab facility at time of the two week follow up appointment, the skilled rehab facility will also need to assist the patient in arranging follow up appointment in our office and any transportation needs. ° °MAKE SURE YOU:  °Understand these instructions.  °Will watch your condition.  °Will get help right away if you are not doing well or get worse. ° °Pick up stool softner and  laxative for home use following surgery while on pain medications. °Do not remove your dressing. °The dressing is waterproof--it is OK to take showers. °Continue to use ice for pain and swelling after surgery. °Do not use any lotions or creams on the incision until instructed by your surgeon. °Total Hip Protocol. ° ° ° °Information on my medicine - ELIQUIS® (apixaban) ° °This medication education was reviewed with me or my healthcare representative as part of my discharge preparation.  The pharmacist that spoke with me during my hospital stay was:  Clarence Cogswell Dien, RPH ° °Why was Eliquis® prescribed for you? °Eliquis® was prescribed for you to reduce the risk of blood clots forming after orthopedic surgery.   ° °What do You need to know about Eliquis®? °Take your Eliquis® TWICE DAILY - one tablet in the morning and one tablet in the evening with or without food.  It would be best to take the dose about the same   time each day. ° °If you have difficulty swallowing the tablet whole please discuss with your pharmacist how to take the medication safely. ° °Take Eliquis® exactly as prescribed by your doctor and DO NOT stop taking Eliquis® without talking to the doctor who prescribed the medication.  Stopping without other medication to take the place of Eliquis® may increase your risk of developing a clot. ° °After discharge, you should have regular check-up appointments with your healthcare provider that is prescribing your Eliquis®. ° °What do you do if you miss a dose? °If a dose of ELIQUIS® is not taken at the scheduled time, take it as soon as possible on the same day and twice-daily administration should be resumed.  The dose should not be doubled to make up for a missed dose.  Do not take more than one tablet of ELIQUIS at the same time. ° °Important Safety Information °A possible side effect of Eliquis® is bleeding. You should call your healthcare provider right away if you experience any of the  following: °? Bleeding from an injury or your nose that does not stop. °? Unusual colored urine (red or dark brown) or unusual colored stools (red or black). °? Unusual bruising for unknown reasons. °? A serious fall or if you hit your head (even if there is no bleeding). ° °Some medicines may interact with Eliquis® and might increase your risk of bleeding or clotting while on Eliquis®. To help avoid this, consult your healthcare provider or pharmacist prior to using any new prescription or non-prescription medications, including herbals, vitamins, non-steroidal anti-inflammatory drugs (NSAIDs) and supplements. ° °This website has more information on Eliquis® (apixaban): http://www.eliquis.com/eliquis/home ° ° °

## 2016-10-18 NOTE — Progress Notes (Signed)
Occupational Therapy Treatment Patient Details Name: Gwendolyn Bennett MRN: [id number] DOB: 25-Apr-1928 Today's Date: 10/18/2016    History of present illness 83 yo s/p T ant THA   OT comments  Completed education regarding use of AE for LB ADL. Pt/family verbalized understanding. Reviewed strategies to reduce risk of falls. Pt ready to DC home when medically stable.   Follow Up Recommendations  No OT follow up;Supervision/Assistance - 24 hour    Equipment Recommendations  None recommended by OT    Recommendations for Other Services      Precautions / Restrictions Precautions Precautions: Fall Restrictions Weight Bearing Restrictions: Yes RLE Weight Bearing: Weight bearing as tolerated       Mobility Bed Mobility OOB walking with nurse  Transfers Overall transfer level: Needs assistance Equipment used: Rolling walker (2 wheeled) Transfers: Sit to/from Stand;Stand Pivot Transfers Sit to Stand: Supervision Stand pivot transfers: Supervision       General transfer comment: reminder for hand placement. Performed from [facility]    Balance Overall balance assessment: Needs assistance Sitting-balance support: No upper extremity supported Sitting balance-Leahy Scale: Good     Standing balance support: Bilateral upper extremity supported Standing balance-Leahy Scale: Fair Standing balance comment: using rw for support                   ADL                                         General ADL Comments: Completed education regarding useof AE for LB ADL. Pt able to return demonstrate use of sock aid,, reacher and long handled shoe horn adn sponge to increase independence with ADL and reduce risk of falls. Reviewed strategies to reduce risk of falls.       Vision                     Perception     Praxis      Cognition   Behavior During Therapy: WFL for tasks assessed/performed Overall Cognitive Status: Within Functional Limits for tasks  assessed                         Exercises     Shoulder Instructions       General Comments      Pertinent Vitals/ Pain       Pain Assessment: Faces Pain Score: 2  Faces Pain Scale: Hurts a little bit Pain Location: R hip Pain Descriptors / Indicators: Sore Pain Intervention(s): Limited activity within patient's tolerance  Home Living Family/patient expects to be discharged to:: Private residence Living Arrangements: Alone Available Help at Discharge: Family;Friend(s);Available 24 hours/day Type of Home: House Home Access: Stairs to enter [employer] of Steps: 1 Entrance Stairs-Rails: Left Home Layout: One level               Home Equipment: Walker - 2 wheels;Walker - 4 wheels          Prior Functioning/Environment Level of Independence: Independent        Comments: used rollator or furniture walker when needed due to hip pain   Frequency           Progress Toward Goals  OT Goals(current goals can now be found in the care plan section)  Progress towards OT goals: Goals met/education completed, patient discharged from OT  Acute Rehab OT Goals  Patient Stated Goal: get back to being independent OT Goal Formulation: With patient Time For Goal Achievement: 10/25/16 Potential to Achieve Goals: Good ADL Goals Additional ADL Goal #1: Pt will demonstrate understanding of use of AE for LB ADL with min vc  Plan Discharge plan remains appropriate;All goals met and education completed, patient discharged from OT services    Co-evaluation                 End of Session Equipment Utilized During Treatment: Rolling walker  OT Visit Diagnosis: Other abnormalities of gait and mobility (R26.89)   Activity Tolerance Patient tolerated treatment well   Patient Left in chair;with call bell/phone within reach;with family/visitor present   Nurse Communication Mobility status        Time: 1353-1406 OT Time Calculation (min): 13  min  Charges: OT General Charges $OT Visit: 1 Procedure OT Treatments $Self Care/Home Management : 8-22 mins  [facility], OT/L  [phone] 10/18/2016   Gwendolyn Bennett 10/18/2016, 2:42 PM

## 2016-10-24 ENCOUNTER — Other Ambulatory Visit: Payer: Self-pay | Admitting: "Endocrinology

## 2017-01-09 ENCOUNTER — Ambulatory Visit (INDEPENDENT_AMBULATORY_CARE_PROVIDER_SITE_OTHER): Payer: Medicare Other | Admitting: "Endocrinology

## 2017-01-09 ENCOUNTER — Encounter: Payer: Self-pay | Admitting: "Endocrinology

## 2017-01-09 VITALS — BP 131/82 | HR 80 | Ht 67.0 in | Wt 158.0 lb

## 2017-01-09 DIAGNOSIS — E890 Postprocedural hypothyroidism: Secondary | ICD-10-CM

## 2017-01-09 MED ORDER — LEVOTHYROXINE SODIUM 112 MCG PO TABS: 112.0000 ug | ORAL_TABLET | Freq: Every morning | ORAL | 6 refills | Status: DC

## 2017-01-09 NOTE — Progress Notes (Signed)
Subjective:    Patient ID: Gwendolyn Bennett, female    DOB: Jul 14, 1928, PCP Kennieth Rad, MD   Past Medical History:  Diagnosis Date  . Arthritis   . Depression   . Dysrhythmia   . GERD (gastroesophageal reflux disease)   . Hypertension   . Hypothyroidism   . Malignant carcinoid tumor ([place])    States she has neurdendocrine  . Thyroid cancer ([place])   . Zollinger-Ellison syndrome    Past Surgical History:  Procedure Laterality Date  . ABDOMINAL HYSTERECTOMY    . LYSIS OF ADHESION    . OOPHORECTOMY    . THYROID SURGERY     times 2  . TOTAL HIP ARTHROPLASTY Right 10/17/2016   Procedure: RIGHT TOTAL HIP ARTHROPLASTY ANTERIOR APPROACH;  Surgeon: Rod Can, MD;  Location: [facility];  Service: Orthopedics;  Laterality: Right;   Social History   Social History  . Marital status: Widowed    Spouse name: N/A  . Number of children: N/A  . Years of education: N/A   Social History Main Topics  . Smoking status: Never Smoker  . Smokeless tobacco: Never Used  . Alcohol use No  . Drug use: No  . Sexual activity: Not Asked   Other Topics Concern  . None   Social History Narrative  . None   Outpatient Encounter Prescriptions as of 01/09/2017  Medication Sig  . apixaban (ELIQUIS) 2.5 MG TABS tablet Take 1 tablet (2.5 mg total) by mouth every 12 (twelve) hours.  Marland Kitchen atenolol (TENORMIN) 50 MG tablet Take 50 mg by mouth daily.  Marland Kitchen BIOTIN 5000 PO Take 1 tablet by mouth daily.  . calcitRIOL (ROCALTROL) 0.25 MCG capsule Take 0.25 mcg by mouth daily.  . Calcium Citrate-Vitamin D (CITRACAL + D PO) Take 1 tablet by mouth 2 (two) times daily.  . clidinium-chlordiazePOXIDE (LIBRAX) 5-2.5 MG capsule Take 1 capsule by mouth 3 (three) times daily as needed.   . docusate sodium (COLACE) 100 MG capsule Take 1 capsule (100 mg total) by mouth 2 (two) times daily.  Marland Kitchen esomeprazole (NEXIUM) 40 MG capsule Take 40 mg by mouth 2 (two) times daily before a meal.  . FLUoxetine (PROZAC) 20 MG capsule Take 20  mg by mouth daily.   Marland Kitchen gabapentin (NEURONTIN) 100 MG capsule Take 100 mg by mouth 3 (three) times daily.  . hydrochlorothiazide (HYDRODIURIL) 25 MG tablet Take 25 mg by mouth daily.  Marland Kitchen HYDROcodone-acetaminophen (NORCO/VICODIN) 5-325 MG tablet Take 1-2 tablets by mouth every 4 (four) hours as needed (breakthrough pain).  . LORazepam (ATIVAN) 1 MG tablet Take 1 mg by mouth at bedtime.  . ondansetron (ZOFRAN) 4 MG tablet Take 1 tablet (4 mg total) by mouth every 6 (six) hours as needed for nausea.  Marland Kitchen senna (SENOKOT) 8.6 MG TABS tablet Take 2 tablets (17.2 mg total) by mouth at bedtime.  Marland Kitchen SYNTHROID 100 MCG tablet TAKE 1 TABLET BY MOUTH IN THE MORNING.   No facility-administered encounter medications on file as of 01/09/2017.    ALLERGIES: Allergies  Allergen Reactions  . Amoxicillin-Pot Clavulanate Other (See Comments)    UNSPECIFIED REACTION   Has patient had a PCN reaction causing immediate rash, facial/tongue/throat swelling, SOB or lightheadedness with hypotension: UNKNOWN Has patient had a PCN reaction causing severe rash involving mucus membranes or skin necrosis: UNKNOWN Has patient had a PCN reaction that required hospitalization UNKNOWN Has patient had a PCN reaction occurring within the last 10 years: No If all of the above answers  are "NO", then may proceed with Cephalosporin use. Unknown   . Levaquin [Levofloxacin In D5w] Other (See Comments)    UNSPECIFIED REACTION   . Codeine Nausea Only  . Dexilant [Dexlansoprazole] Itching and Nausea And Vomiting  . Nitrofuran Derivatives Nausea And Vomiting  . Sulfa Antibiotics Rash   VACCINATION STATUS:  There is no immunization history on file for this patient.  Thyroid Problem  Presents for follow-up visit.   Gwendolyn Bennett is 83 year old female patient with medical history as above. She is being seen in follow-up for postsurgical hypothyroidism is new set of thyroid function tests. She is currently on Synthroid 100 g by mouth every  morning. She has no new complaints.    She denies cold/heat intolerance. She has steady body weight. - She denies family history of thyroid cancer. She denies any exposure to neck radiation.  Review of Systems Constitutional: + weight gain, no fatigue, no subjective hyperthermia/hypothermia Eyes: no blurry vision, no xerophthalmia ENT: no sore throat, no nodules palpated in throat, no dysphagia/odynophagia, no hoarseness Cardiovascular: no CP/SOB/palpitations/leg swelling Respiratory: no cough/SOB Gastrointestinal: no N/V/D/C Musculoskeletal: no muscle/joint aches Skin: no rashes Neurological: no tremors/numbness/tingling/dizziness Psychiatric: no depression/anxiety  Objective:    BP 131/82   Pulse 80   Ht '5\' 7"'  (1.702 m)   Wt 158 lb (71.7 kg)   BMI 24.75 kg/m   Wt Readings from Last 3 Encounters:  01/09/17 158 lb (71.7 kg)  10/17/16 154 lb (69.9 kg)  10/10/16 154 lb 12.8 oz (70.2 kg)    Physical Exam Constitutional:  in NAD Eyes: PERRLA, EOMI, no exophthalmos ENT: moist mucous membranes, post thyroidectomy scar on anterior lower neck, no cervical lymphadenopathy Cardiovascular: RRR, No MRG Respiratory: CTA B Gastrointestinal: abdomen soft, NT, ND, BS+ Musculoskeletal: no deformities, strength intact in all 4 Skin: moist, warm, no rashes Neurological: no tremor with outstretched hands, DTR normal in all 4  Recent Results (from the past 2160 hour(s))  CBC     Status: Abnormal   Collection Time: 10/18/16  5:28 AM  Result Value Ref Range   WBC 9.9 4.0 - 10.5 K/uL   RBC 2.89 (L) 3.87 - 5.11 MIL/uL   Hemoglobin 8.8 (L) 12.0 - 15.0 g/dL   HCT 26.3 (L) 36.0 - 46.0 %   MCV 91.0 78.0 - 100.0 fL   MCH 30.4 26.0 - 34.0 pg   MCHC 33.5 30.0 - 36.0 g/dL   RDW 13.3 11.5 - 15.5 %   Platelets 205 150 - 400 K/uL  Basic metabolic panel     Status: Abnormal   Collection Time: 10/18/16  5:28 AM  Result Value Ref Range   Sodium 130 (L) 135 - 145 mmol/L   Potassium 3.0 (L) 3.5 -  5.1 mmol/L   Chloride 96 (L) 101 - 111 mmol/L   CO2 26 22 - 32 mmol/L   Glucose, Bld 99 65 - 99 mg/dL   BUN 9 6 - 20 mg/dL   Creatinine, Ser 0.56 0.44 - 1.00 mg/dL   Calcium 7.2 (L) 8.9 - 10.3 mg/dL   GFR calc non Af Amer >60 >60 mL/min   GFR calc Af Amer >60 >60 mL/min    Comment: (NOTE) The eGFR has been calculated using the CKD EPI equation. This calculation has not been validated in all clinical situations. eGFR's persistently <60 mL/min signify possible Chronic Kidney Disease.    Anion gap 8 5 - 15   - Thyroid/neck ultrasound  on 07/27/2016 is consistent with surgically absent thyroid.  Assessment & Plan:   1. Postoperative hypothyroidism 2. Thyroid malignancy treated in the late 90s, records not available for review today  -Based on her new set of thyroid function tests She will benefit from slight increase in her Synthroid dose. I will prescribe Synthroid 112 g by mouth every morning. She will return in 3 months with repeat thyroid function tests.   - We discussed about correct intake of levothyroxine, at fasting, with water, separated by at least 30 minutes from breakfast, and separated by more than 4 hours from calcium, iron, multivitamins, acid reflux medications (PPIs). -Patient is made aware of the fact that thyroid hormone replacement is needed for life, dose to be adjusted by periodic monitoring of thyroid function tests.  - Her surveillance neck/I wrote ultrasound was remarkable for surgically absent thyroid, no need for further intervention or workup. - I advised patient to maintain close follow up with Kennieth Rad, MD for primary care needs. Follow up plan: Return in about 3 months (around 04/11/2017) for follow up with pre-visit labs.  Glade Lloyd, MD Phone: [phone]  Fax: [phone]   01/09/2017, 3:22 PM

## 2017-01-20 NOTE — Addendum Note (Signed)
Addendum  created 01/20/17 1204 by Rica Koyanagi, MD   Sign clinical note

## 2017-01-30 ENCOUNTER — Ambulatory Visit: Payer: Medicare Other | Admitting: "Endocrinology

## 2017-01-31 ENCOUNTER — Ambulatory Visit: Payer: Medicare Other | Admitting: "Endocrinology

## 2017-04-05 ENCOUNTER — Other Ambulatory Visit: Payer: Self-pay

## 2017-04-05 ENCOUNTER — Other Ambulatory Visit: Payer: Self-pay | Admitting: "Endocrinology

## 2017-04-05 DIAGNOSIS — E890 Postprocedural hypothyroidism: Secondary | ICD-10-CM

## 2017-04-05 LAB — TSH: TSH: 0.47 mIU/L

## 2017-04-05 LAB — T4, FREE: FREE T4: 1.7 ng/dL (ref 0.8–1.8)

## 2017-04-11 ENCOUNTER — Ambulatory Visit (INDEPENDENT_AMBULATORY_CARE_PROVIDER_SITE_OTHER): Payer: Medicare Other | Admitting: "Endocrinology

## 2017-04-11 ENCOUNTER — Encounter: Payer: Self-pay | Admitting: "Endocrinology

## 2017-04-11 VITALS — BP 133/73 | HR 69 | Ht 67.0 in | Wt 155.0 lb

## 2017-04-11 DIAGNOSIS — C73 Malignant neoplasm of thyroid gland: Secondary | ICD-10-CM

## 2017-04-11 DIAGNOSIS — E890 Postprocedural hypothyroidism: Secondary | ICD-10-CM | POA: Diagnosis not present

## 2017-04-11 MED ORDER — LEVOTHYROXINE SODIUM 112 MCG PO TABS: 112.0000 ug | ORAL_TABLET | Freq: Every day | ORAL | 2 refills | Status: DC

## 2017-04-11 NOTE — Progress Notes (Signed)
Subjective:    Patient ID: Gwendolyn Bennett, female    DOB: [birth year], PCP Kennieth Rad, MD   Past Medical History:  Diagnosis Date  . Arthritis   . Depression   . Dysrhythmia   . GERD (gastroesophageal reflux disease)   . Hypertension   . Hypothyroidism   . Malignant carcinoid tumor ([place])    States she has neurdendocrine  . Thyroid cancer ([place])   . Zollinger-Ellison syndrome    Past Surgical History:  Procedure Laterality Date  . ABDOMINAL HYSTERECTOMY    . LYSIS OF ADHESION    . OOPHORECTOMY    . THYROID SURGERY     times 2  . TOTAL HIP ARTHROPLASTY Right 10/17/2016   Procedure: RIGHT TOTAL HIP ARTHROPLASTY ANTERIOR APPROACH;  Surgeon: Rod Can, MD;  Location: [facility];  Service: Orthopedics;  Laterality: Right;   Social History   Social History  . Marital status: Widowed    Spouse name: N/A  . Number of children: N/A  . Years of education: N/A   Social History Main Topics  . Smoking status: Never Smoker  . Smokeless tobacco: Never Used  . Alcohol use No  . Drug use: No  . Sexual activity: Not Asked   Other Topics Concern  . None   Social History Narrative  . None   Outpatient Encounter Prescriptions as of 04/11/2017  Medication Sig  . apixaban (ELIQUIS) 2.5 MG TABS tablet Take 1 tablet (2.5 mg total) by mouth every 12 (twelve) hours.  Marland Kitchen atenolol (TENORMIN) 50 MG tablet Take 50 mg by mouth daily.  . calcitRIOL (ROCALTROL) 0.25 MCG capsule Take 0.25 mcg by mouth daily.  . Calcium Citrate-Vitamin D (CITRACAL + D PO) Take 1 tablet by mouth 2 (two) times daily.  . clidinium-chlordiazePOXIDE (LIBRAX) 5-2.5 MG capsule Take 1 capsule by mouth 3 (three) times daily as needed.   . docusate sodium (COLACE) 100 MG capsule Take 1 capsule (100 mg total) by mouth 2 (two) times daily.  Marland Kitchen esomeprazole (NEXIUM) 40 MG capsule Take 40 mg by mouth 2 (two) times daily before a meal.  . FLUoxetine (PROZAC) 20 MG capsule Take 20 mg by mouth daily.   Marland Kitchen gabapentin  (NEURONTIN) 100 MG capsule Take 100 mg by mouth 3 (three) times daily.  . hydrochlorothiazide (HYDRODIURIL) 25 MG tablet Take 25 mg by mouth daily.  Marland Kitchen HYDROcodone-acetaminophen (NORCO/VICODIN) 5-325 MG tablet Take 1-2 tablets by mouth every 4 (four) hours as needed (breakthrough pain).  Marland Kitchen levothyroxine (SYNTHROID, LEVOTHROID) 112 MCG tablet Take 1 tablet (112 mcg total) by mouth daily before breakfast.  . LORazepam (ATIVAN) 1 MG tablet Take 1 mg by mouth at bedtime.  . ondansetron (ZOFRAN) 4 MG tablet Take 1 tablet (4 mg total) by mouth every 6 (six) hours as needed for nausea.  Marland Kitchen senna (SENOKOT) 8.6 MG TABS tablet Take 2 tablets (17.2 mg total) by mouth at bedtime.  . [DISCONTINUED] BIOTIN 5000 PO Take 1 tablet by mouth daily.  . [DISCONTINUED] levothyroxine (SYNTHROID, LEVOTHROID) 112 MCG tablet Take 1 tablet (112 mcg total) by mouth every morning.   No facility-administered encounter medications on file as of 04/11/2017.    ALLERGIES: Allergies  Allergen Reactions  . Amoxicillin-Pot Clavulanate Other (See Comments)    UNSPECIFIED REACTION   Has patient had a PCN reaction causing immediate rash, facial/tongue/throat swelling, SOB or lightheadedness with hypotension: UNKNOWN Has patient had a PCN reaction causing severe rash involving mucus membranes or skin necrosis: UNKNOWN Has patient had  a PCN reaction that required hospitalization UNKNOWN Has patient had a PCN reaction occurring within the last 10 years: No If all of the above answers are "NO", then may proceed with Cephalosporin use. Unknown   . Levaquin [Levofloxacin In D5w] Other (See Comments)    UNSPECIFIED REACTION   . Codeine Nausea Only  . Dexilant [Dexlansoprazole] Itching and Nausea And Vomiting  . Nitrofuran Derivatives Nausea And Vomiting  . Sulfa Antibiotics Rash   VACCINATION STATUS:  There is no immunization history on file for this patient.  Thyroid Problem  Presents for follow-up visit.   Gwendolyn Bennett is  83 year old female patient with medical history as above. She is being seen in follow-up for postsurgical hypothyroidism is new set of thyroid function tests. She is currently on Synthroid 112 g by mouth every morning. She has no new complaints.    She denies cold/heat intolerance. She has steady body weight. - She denies family history of thyroid cancer. She denies any exposure to neck radiation.  Review of Systems Constitutional: + weight loss, no fatigue, no subjective hyperthermia/hypothermia Eyes: no blurry vision, no xerophthalmia ENT: no sore throat, no nodules palpated in throat, no dysphagia/odynophagia, no hoarseness Cardiovascular: no CP/SOB/palpitations/leg swelling Respiratory: no cough/SOB Gastrointestinal: no N/V/D/C Musculoskeletal: no muscle/joint aches Skin: no rashes Neurological: no tremors/numbness/tingling/dizziness Psychiatric: no depression/anxiety  Objective:    BP 133/73   Pulse 69   Ht 5\' 7"  (1.702 m)   Wt 155 lb (70.3 kg)   BMI 24.28 kg/m   Wt Readings from Last 3 Encounters:  04/11/17 155 lb (70.3 kg)  01/09/17 158 lb (71.7 kg)  10/17/16 154 lb (69.9 kg)    Physical Exam Constitutional:  in NAD Eyes: PERRLA, EOMI, no exophthalmos ENT: moist mucous membranes, + post thyroidectomy scar on anterior lower neck, no cervical lymphadenopathy Cardiovascular: RRR, No MRG Respiratory: CTA B Gastrointestinal: abdomen soft, NT, ND, BS+ Musculoskeletal: no deformities, strength intact in all 4 Skin: moist, warm, no rashes Neurological: no tremor with outstretched hands, DTR normal in all 4  Recent Results (from the past 2160 hour(s))  T4, free     Status: None   Collection Time: 04/05/17  2:45 PM  Result Value Ref Range   Free T4 1.7 0.8 - 1.8 ng/dL  TSH     Status: None   Collection Time: 04/05/17  2:45 PM  Result Value Ref Range   TSH 0.47 mIU/L    Comment:   Reference Range   > or = 20 Years  0.40-4.50   Pregnancy Range First trimester   0.26-2.66 Second trimester 0.55-2.73 Third trimester  0.43-2.91      - Thyroid/neck ultrasound  on 07/27/2016 is consistent with surgically absent thyroid.  Assessment & Plan:   1. Postoperative hypothyroidism 2. Thyroid malignancy treated in the late 90s  -Her thyroid function tests are consistent with appropriate replacement.  I advised her to discontinue Biotin, which can affect analysis of her thyroid function tests.   I will continue  Synthroid 112 g by mouth every morning. She will return in 9 months with repeat thyroid function tests.   - We discussed about correct intake of levothyroxine, at fasting, with water, separated by at least 30 minutes from breakfast, and separated by more than 4 hours from calcium, iron, multivitamins, acid reflux medications (PPIs). -Patient is made aware of the fact that thyroid hormone replacement is needed for life, dose to be adjusted by periodic monitoring of thyroid function tests.  Regarding her history of  thyroid malignancy: - Her surveillance neck/I wrote ultrasound was remarkable for surgically absent thyroid, no need for further intervention or workup.  - I advised patient to maintain close follow up with Kennieth Rad, MD for primary care needs. Follow up plan: Return in about 9 months (around 01/09/2018) for follow up with pre-visit labs.  Glade Lloyd, MD Phone: [phone]  Fax: [phone]  This note was partially dictated with voice recognition software. Similar sounding words can be transcribed inadequately or may not  be corrected upon review.  04/11/2017, 2:59 PM

## 2017-09-30 ENCOUNTER — Other Ambulatory Visit: Payer: Self-pay | Admitting: "Endocrinology

## 2018-01-11 ENCOUNTER — Ambulatory Visit: Payer: Medicare Other | Admitting: "Endocrinology

## 2018-02-01 ENCOUNTER — Other Ambulatory Visit: Payer: Self-pay | Admitting: "Endocrinology

## 2018-02-01 DIAGNOSIS — E039 Hypothyroidism, unspecified: Secondary | ICD-10-CM

## 2018-02-01 LAB — T4, FREE: Free T4: 1.5 ng/dL (ref 0.8–1.8)

## 2018-02-01 LAB — TSH: TSH: 0.24 m[IU]/L — AB (ref 0.40–4.50)

## 2018-02-08 ENCOUNTER — Encounter: Payer: Medicare Other | Admitting: "Endocrinology

## 2018-02-08 ENCOUNTER — Encounter: Payer: Self-pay | Admitting: "Endocrinology

## 2018-02-09 ENCOUNTER — Telehealth: Payer: Self-pay | Admitting: "Endocrinology

## 2018-02-09 NOTE — Telephone Encounter (Signed)
Gwendolyn Bennett is asking if Dr. Dorris Fetch would please just call her with her lab results instead of her trying to r/s and have someone bring her back in, she has transportation issues, please advise?

## 2018-02-12 ENCOUNTER — Other Ambulatory Visit: Payer: Self-pay | Admitting: "Endocrinology

## 2018-02-12 MED ORDER — LEVOTHYROXINE SODIUM 100 MCG PO TABS: 100.0000 ug | ORAL_TABLET | Freq: Every day | ORAL | 1 refills | Status: DC

## 2018-02-12 NOTE — Telephone Encounter (Signed)
I reviewed her labs and showing at this time her dose needs to be adjusted. I will prescribe 100 mcg of thyroid hormone to replace her current dose of 112 mcg. She needs labs work and office visit in 6 months.

## 2018-02-12 NOTE — Progress Notes (Signed)
This encounter was created in error - please disregard.

## 2018-05-11 ENCOUNTER — Other Ambulatory Visit: Payer: Self-pay | Admitting: "Endocrinology

## 2018-07-02 ENCOUNTER — Other Ambulatory Visit: Payer: Self-pay | Admitting: "Endocrinology

## 2018-07-02 DIAGNOSIS — E039 Hypothyroidism, unspecified: Secondary | ICD-10-CM

## 2018-07-03 LAB — T4, FREE: Free T4: 1.4 ng/dL (ref 0.8–1.8)

## 2018-07-03 LAB — TSH: TSH: 1.44 m[IU]/L (ref 0.40–4.50)

## 2018-07-11 ENCOUNTER — Telehealth: Payer: Self-pay | Admitting: "Endocrinology

## 2018-07-11 NOTE — Telephone Encounter (Signed)
She needs to return for office visit, missing a lot of office visits.

## 2018-07-11 NOTE — Telephone Encounter (Signed)
Gwendolyn Bennett is calling asking for lab results

## 2018-07-13 NOTE — Telephone Encounter (Signed)
Notified pt. She became very argumentative and began yelling. She states that we do not have all of her visits in her chart. Pt does however have appt in Jan of 2020

## 2018-07-15 IMAGING — US US SOFT TISSUE HEAD/NECK
1 series · 14 of 23 positions shown · non-contrast
Comparison: None.

CLINICAL DATA: Other. History of thyroid cancer post total
thyroidectomy.

EXAM:
THYROID ULTRASOUND
TECHNIQUE: Ultrasound examination of the thyroid gland and adjacent soft
tissues was performed.

[Series 1: us soft tissue head/neck · 0.07mm/px · 14 of 23 slices shown]
[im 1/23]
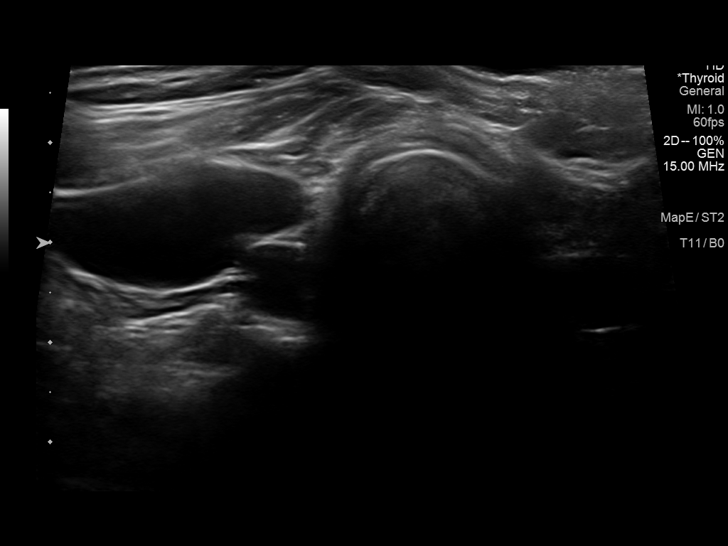
[im 3/23]
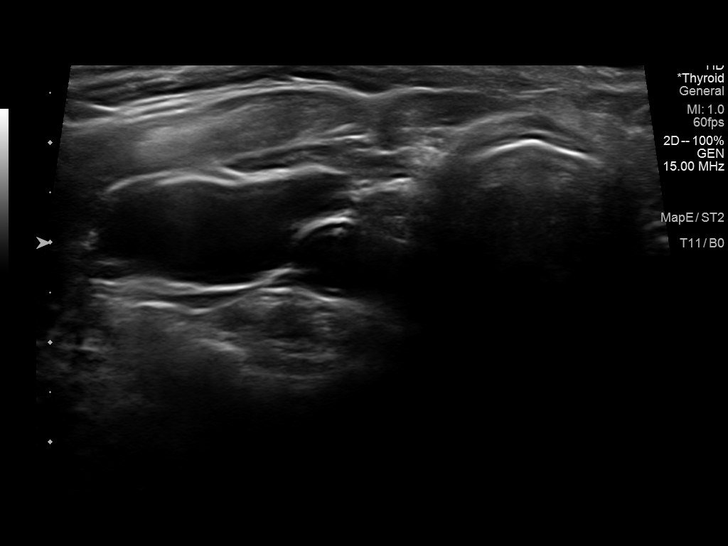
[im 5/23]
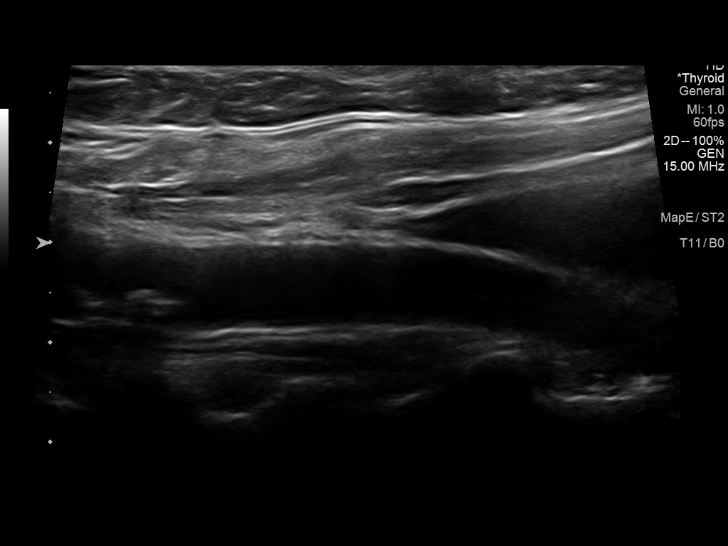
[im 6/23]
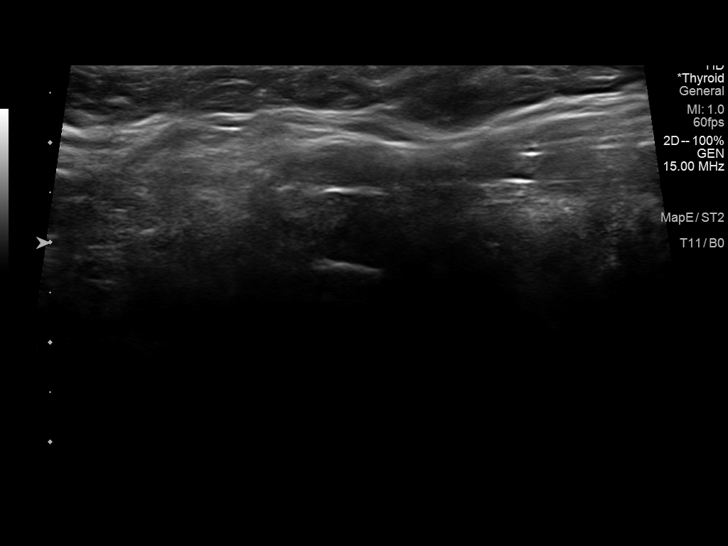
[im 8/23]
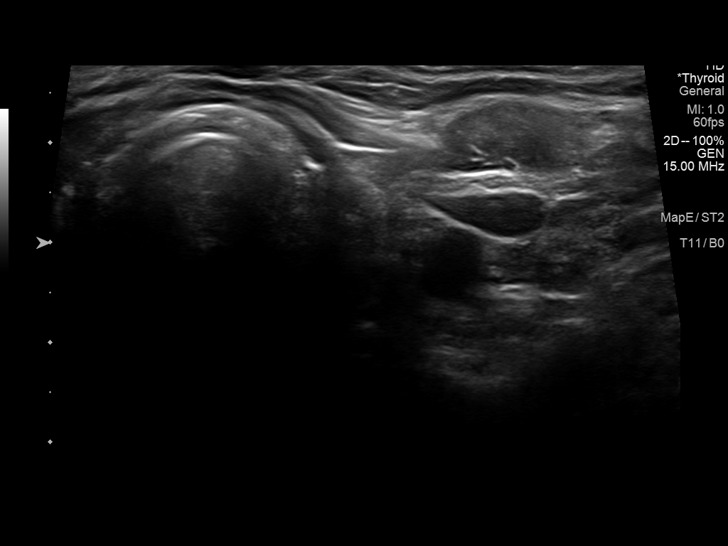
[im 10/23]
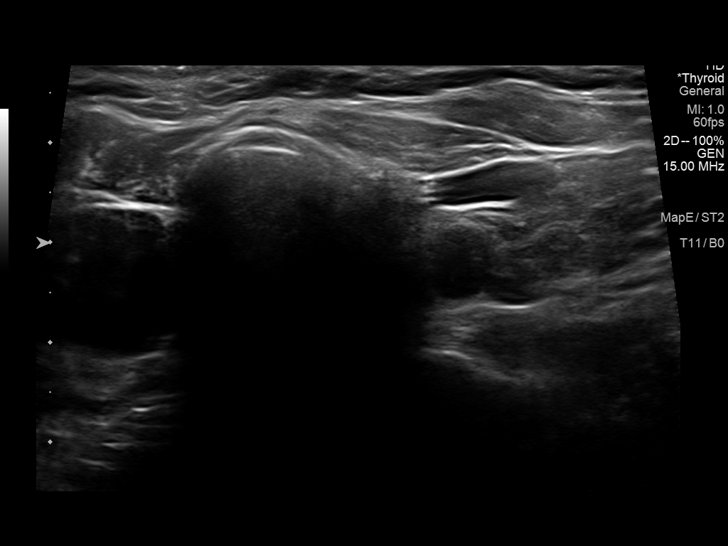
[im 11/23]
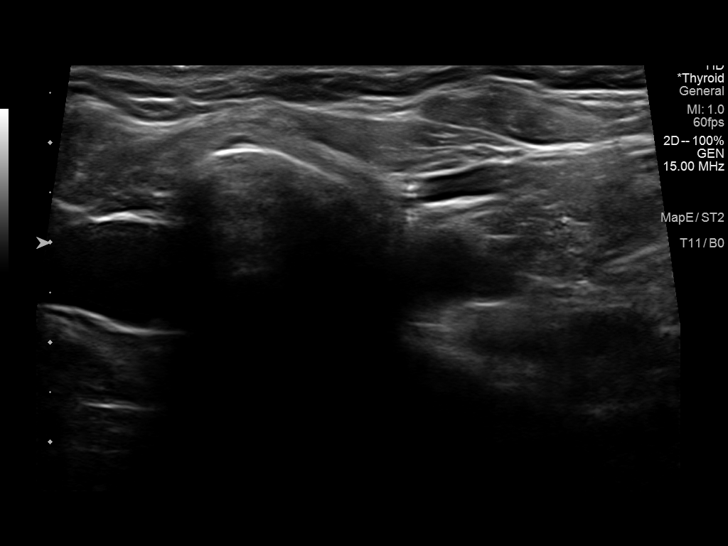
[im 13/23]
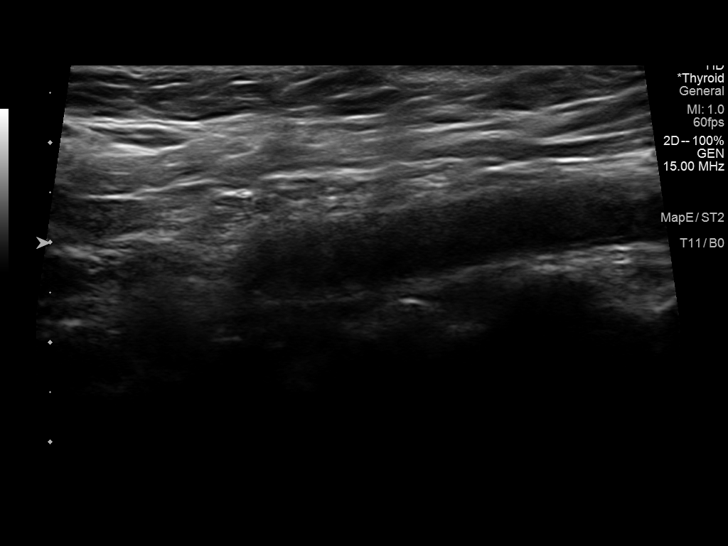
[im 14/23]
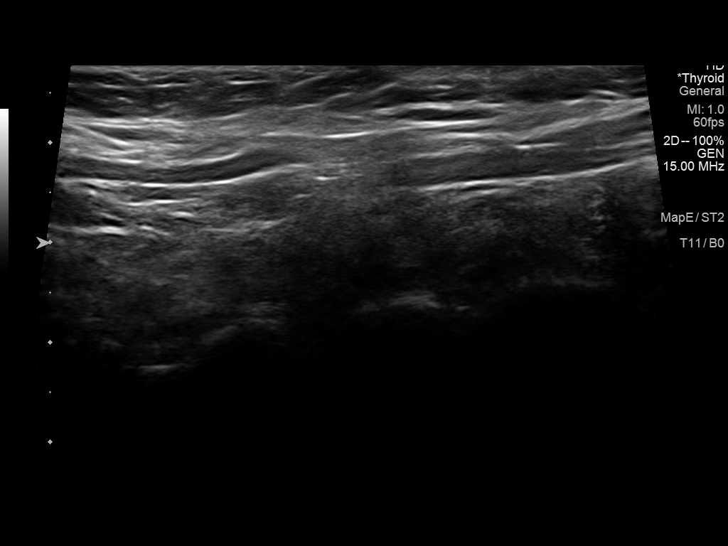
[im 16/23]
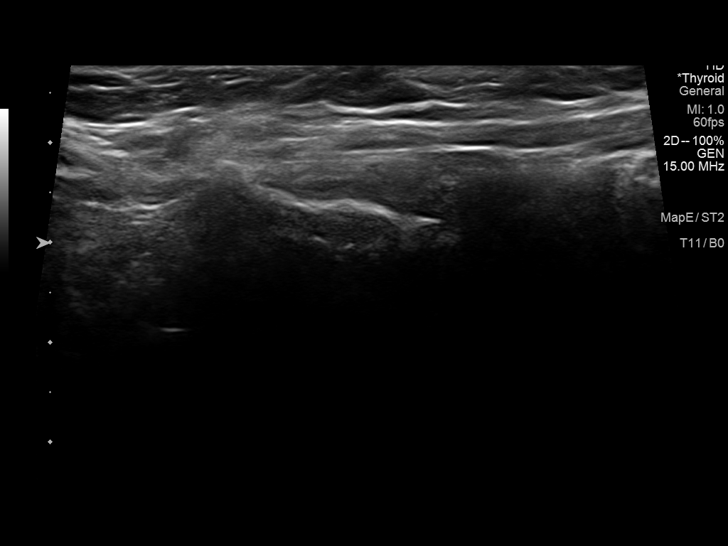
[im 18/23]
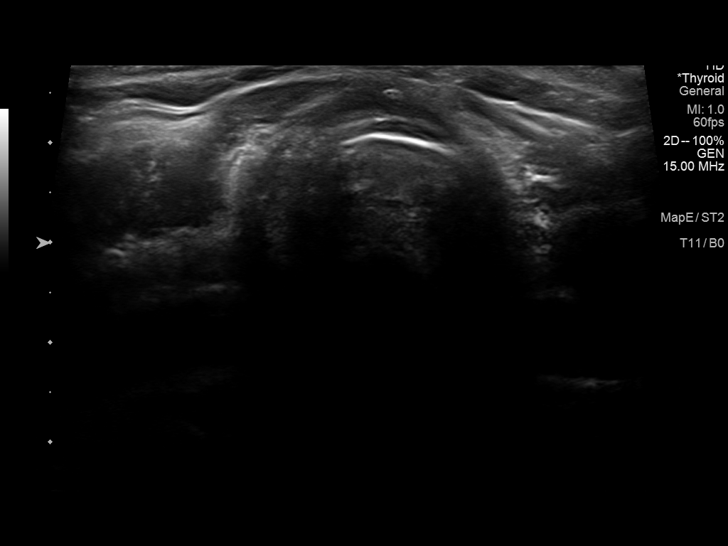
[im 19/23]
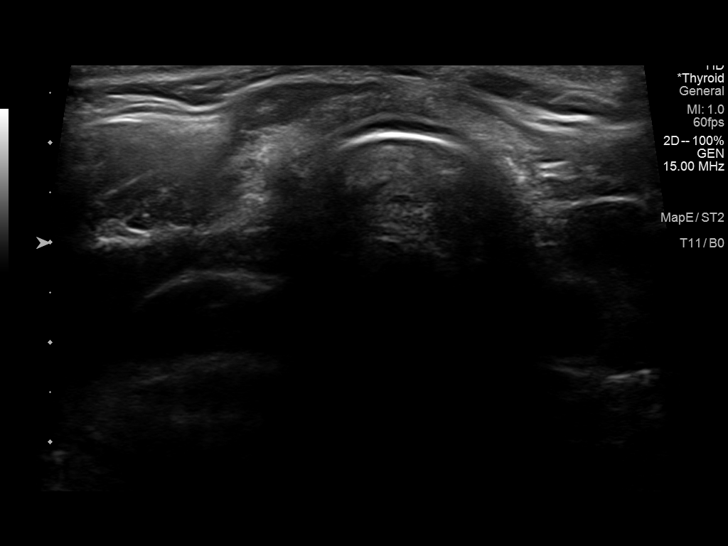
[im 21/23]
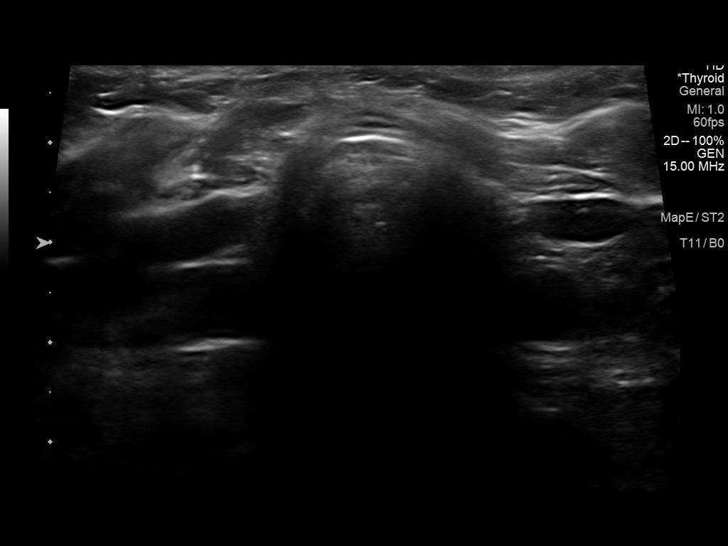
[im 23/23]
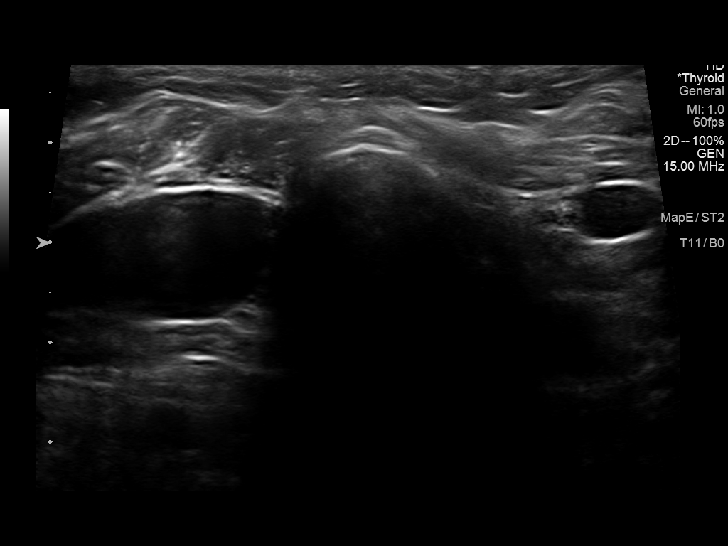

[14 of 23 positions shown; findings below may reference images not displayed]

FINDINGS: Isthmus: Surgically absent. There is no residual nodular soft tissue
within the thyroid isthmic resection bed.

_________________________________________________________

Right lobe: Surgically absent. There is no residual nodular soft
tissue within the right thyroid lobectomy bed.

_________________________________________________________

Left lobe: Surgically absent. There is no residual nodular soft
tissue within the left thyroid lobectomy bed.
IMPRESSION: Post total thyroidectomy without evidence of residual or locally
recurrent disease.

## 2018-08-09 ENCOUNTER — Other Ambulatory Visit: Payer: Self-pay | Admitting: "Endocrinology

## 2018-08-23 ENCOUNTER — Ambulatory Visit: Payer: Medicare Other | Admitting: "Endocrinology

## 2018-09-06 ENCOUNTER — Ambulatory Visit: Payer: Medicare Other | Admitting: "Endocrinology

## 2018-09-11 ENCOUNTER — Other Ambulatory Visit: Payer: Self-pay | Admitting: "Endocrinology

## 2018-09-14 ENCOUNTER — Telehealth: Payer: Self-pay

## 2018-09-14 DIAGNOSIS — E890 Postprocedural hypothyroidism: Secondary | ICD-10-CM

## 2018-09-14 DIAGNOSIS — E039 Hypothyroidism, unspecified: Secondary | ICD-10-CM

## 2018-09-14 LAB — TSH: TSH: 3.52 m[IU]/L (ref 0.40–4.50)

## 2018-09-14 LAB — T4, FREE: Free T4: 1.4 ng/dL (ref 0.8–1.8)

## 2018-09-14 NOTE — Telephone Encounter (Signed)
Gwendolyn Bennett, CMA  

## 2018-09-17 ENCOUNTER — Telehealth: Payer: Self-pay

## 2018-09-17 ENCOUNTER — Other Ambulatory Visit: Payer: Self-pay | Admitting: "Endocrinology

## 2018-09-17 MED ORDER — LEVOTHYROXINE SODIUM 100 MCG PO TABS: ORAL_TABLET | ORAL | 0 refills | Status: DC

## 2018-09-17 NOTE — Telephone Encounter (Signed)
Patient's sister is aware.

## 2018-09-17 NOTE — Telephone Encounter (Signed)
I will send only 1 month. She has not been seen since august 2018.

## 2018-09-17 NOTE — Telephone Encounter (Signed)
Gwendolyn Bennett is calling stating that she is wanting to have her medicine refilled for 3 mons her appointment here is not until 09/28/17 at 11:30 and her labs were drawn on 09/14/17, please advise?

## 2018-09-28 ENCOUNTER — Encounter: Payer: Self-pay | Admitting: "Endocrinology

## 2018-09-28 ENCOUNTER — Ambulatory Visit (INDEPENDENT_AMBULATORY_CARE_PROVIDER_SITE_OTHER): Payer: Medicare Other | Admitting: "Endocrinology

## 2018-09-28 VITALS — BP 122/71 | HR 66 | Ht 67.0 in | Wt 151.0 lb

## 2018-09-28 DIAGNOSIS — E890 Postprocedural hypothyroidism: Secondary | ICD-10-CM | POA: Diagnosis not present

## 2018-09-28 MED ORDER — SYNTHROID 100 MCG PO TABS: ORAL_TABLET | ORAL | 4 refills | Status: DC

## 2018-09-28 NOTE — Progress Notes (Signed)
Endocrinology follow-up note   Subjective:    Patient ID: Gwendolyn Bennett, female    DOB: 04/12/1928, PCP Kennieth Rad, MD   Past Medical History:  Diagnosis Date  . Arthritis   . Depression   . Dysrhythmia   . GERD (gastroesophageal reflux disease)   . Hypertension   . Hypothyroidism   . Malignant carcinoid tumor ([place])    States she has neurdendocrine  . Thyroid cancer ([place])   . Zollinger-Ellison syndrome    Past Surgical History:  Procedure Laterality Date  . ABDOMINAL HYSTERECTOMY    . LYSIS OF ADHESION    . OOPHORECTOMY    . THYROID SURGERY     times 2  . TOTAL HIP ARTHROPLASTY Right 10/17/2016   Procedure: RIGHT TOTAL HIP ARTHROPLASTY ANTERIOR APPROACH;  Surgeon: Rod Can, MD;  Location: [facility];  Service: Orthopedics;  Laterality: Right;   Social History   Socioeconomic History  . Marital status: Widowed    Spouse name: Not on file  . Number of children: Not on file  . Years of education: Not on file  . Highest education level: Not on file  Occupational History  . Not on file  Social Needs  . Financial resource strain: Not on file  . Food insecurity:    Worry: Not on file    Inability: Not on file  . Transportation needs:    Medical: Not on file    Non-medical: Not on file  Tobacco Use  . Smoking status: Never Smoker  . Smokeless tobacco: Never Used  Substance and Sexual Activity  . Alcohol use: No    Alcohol/week: 0.0 standard drinks  . Drug use: No  . Sexual activity: Not on file  Lifestyle  . Physical activity:    Days per week: Not on file    Minutes per session: Not on file  . Stress: Not on file  Relationships  . Social connections:    Talks on phone: Not on file    Gets together: Not on file    Attends religious service: Not on file    Active member of club or organization: Not on file    Attends meetings of clubs or organizations: Not on file    Relationship status: Not on file  Other Topics Concern  . Not on file  Social  History Narrative  . Not on file   Outpatient Encounter Medications as of 09/28/2018  Medication Sig  . atenolol (TENORMIN) 50 MG tablet Take 50 mg by mouth daily.  . calcitRIOL (ROCALTROL) 0.25 MCG capsule Take 0.25 mcg by mouth daily.  . Calcium Citrate-Vitamin D (CITRACAL + D PO) Take 1 tablet by mouth 2 (two) times daily.  Marland Kitchen esomeprazole (NEXIUM) 40 MG capsule Take 40 mg by mouth 2 (two) times daily before a meal.  . FLUoxetine (PROZAC) 20 MG capsule Take 20 mg by mouth daily.   Marland Kitchen gabapentin (NEURONTIN) 100 MG capsule Take 100 mg by mouth 3 (three) times daily.  . hydrochlorothiazide (HYDRODIURIL) 25 MG tablet Take 25 mg by mouth daily.  Marland Kitchen SYNTHROID 100 MCG tablet TAKE (1) TABLET BY MOUTH DAILY BEFORE BREAKFAST.  . [DISCONTINUED] apixaban (ELIQUIS) 2.5 MG TABS tablet Take 1 tablet (2.5 mg total) by mouth every 12 (twelve) hours.  . [DISCONTINUED] clidinium-chlordiazePOXIDE (LIBRAX) 5-2.5 MG capsule Take 1 capsule by mouth 3 (three) times daily as needed.   . [DISCONTINUED] docusate sodium (COLACE) 100 MG capsule Take 1 capsule (100 mg total) by mouth 2 (two)  times daily.  . [DISCONTINUED] HYDROcodone-acetaminophen (NORCO/VICODIN) 5-325 MG tablet Take 1-2 tablets by mouth every 4 (four) hours as needed (breakthrough pain).  . [DISCONTINUED] levothyroxine (SYNTHROID) 100 MCG tablet TAKE (1) TABLET BY MOUTH DAILY BEFORE BREAKFAST.  . [DISCONTINUED] LORazepam (ATIVAN) 1 MG tablet Take 1 mg by mouth at bedtime.  . [DISCONTINUED] ondansetron (ZOFRAN) 4 MG tablet Take 1 tablet (4 mg total) by mouth every 6 (six) hours as needed for nausea.  . [DISCONTINUED] senna (SENOKOT) 8.6 MG TABS tablet Take 2 tablets (17.2 mg total) by mouth at bedtime.   No facility-administered encounter medications on file as of 09/28/2018.    ALLERGIES: Allergies  Allergen Reactions  . Amoxicillin-Pot Clavulanate Other (See Comments)    UNSPECIFIED REACTION   Has patient had a PCN reaction causing immediate rash,  facial/tongue/throat swelling, SOB or lightheadedness with hypotension: UNKNOWN Has patient had a PCN reaction causing severe rash involving mucus membranes or skin necrosis: UNKNOWN Has patient had a PCN reaction that required hospitalization UNKNOWN Has patient had a PCN reaction occurring within the last 10 years: No If all of the above answers are "NO", then may proceed with Cephalosporin use. Unknown   . Levaquin [Levofloxacin In D5w] Other (See Comments)    UNSPECIFIED REACTION   . Codeine Nausea Only  . Dexilant [Dexlansoprazole] Itching and Nausea And Vomiting  . Nitrofuran Derivatives Nausea And Vomiting  . Sulfa Antibiotics Rash   VACCINATION STATUS:  There is no immunization history on file for this patient.  Thyroid Problem  Presents for follow-up visit.   Gwendolyn Bennett is 85 year old female patient with medical history as above. She is being seen in follow-up for postsurgical hypothyroidism is new set of thyroid function tests. She is currently on Synthroid 100 g by mouth every morning. She has no new complaints.    She denies cold/heat intolerance. She has steady body weight. - She denies family history of thyroid cancer. She denies any exposure to neck radiation. -She had what appears to be total thyroidectomy in the late 90s for thyroid malignancy.  Her ultrasound of thyroid/neck was negative for residual mass/lesions in 2017.  Review of Systems Constitutional: + weight loss, no fatigue, no subjective hyperthermia/hypothermia Eyes: no blurry vision, no xerophthalmia ENT: no sore throat, no nodules palpated in throat, no dysphagia/odynophagia, no hoarseness Musculoskeletal: no muscle/joint aches Skin: no rashes Neurological: no tremors/numbness/tingling/dizziness Psychiatric: no depression/anxiety  Objective:    BP 122/71   Pulse 66   Ht 5\' 7"  (1.702 m)   Wt 151 lb (68.5 kg)   BMI 23.65 kg/m   Wt Readings from Last 3 Encounters:  09/28/18 151 lb (68.5 kg)   02/08/18 156 lb (70.8 kg)  04/11/17 155 lb (70.3 kg)    Physical Exam Constitutional:  in NAD Eyes: PERRLA, EOMI, no exophthalmos ENT: moist mucous membranes, + post thyroidectomy scar on anterior lower neck, no cervical lymphadenopathy Cardiovascular: RRR, No MRG Respiratory: CTA B Gastrointestinal: abdomen soft, NT, ND, BS+ Musculoskeletal: no deformities, strength intact in all 4 Skin: moist, warm, no rashes Neurological: no tremor with outstretched hands, DTR normal in all 4  Recent Results (from the past 2160 hour(s))  T4, Free     Status: None   Collection Time: 07/03/18 11:11 AM  Result Value Ref Range   Free T4 1.4 0.8 - 1.8 ng/dL  TSH     Status: None   Collection Time: 07/03/18 11:11 AM  Result Value Ref Range   TSH 1.44 0.40 - 4.50 mIU/L  TSH     Status: None   Collection Time: 09/14/18 10:44 AM  Result Value Ref Range   TSH 3.52 0.40 - 4.50 mIU/L  T4, Free     Status: None   Collection Time: 09/14/18 10:44 AM  Result Value Ref Range   Free T4 1.4 0.8 - 1.8 ng/dL   - Thyroid/neck ultrasound  on 07/27/2016 is consistent with surgically absent thyroid.  Assessment & Plan:   1. Postoperative hypothyroidism 2. Thyroid malignancy treated in the late 90s  -Her Synthroid dose was adjusted in the interim since her last visit.  Her current previsit thyroid function tests are consistent with appropriate replacement.  -I discussed and advised her to continue Synthroid 100 mcg p.o. every morning.    - We discussed about the correct intake of her thyroid hormone, on empty stomach at fasting, with water, separated by at least 30 minutes from breakfast and other medications,  and separated by more than 4 hours from calcium, iron, multivitamins, acid reflux medications (PPIs). -Patient is made aware of the fact that thyroid hormone replacement is needed for life, dose to be adjusted by periodic monitoring of thyroid function tests.   Regarding her history of thyroid  malignancy: - Her December 2017 surveillance neck/I wrote ultrasound was remarkable for surgically absent thyroid, no need for further intervention or workup.  She will have repeat thyroid/neck ultrasound before her next visit in 1 year.  - I advised patient to maintain close follow up with Kennieth Rad, MD for primary care needs. Follow up plan: Return in about 13 months (around 10/27/2019), or In March 2021, with ultrasound in Feb 2021.  Glade Lloyd, MD Phone: [phone]  Fax: [phone]  This note was partially dictated with voice recognition software. Similar sounding words can be transcribed inadequately or may not  be corrected upon review.  09/28/2018, 11:37 AM

## 2018-10-05 IMAGING — CR DG PORTABLE PELVIS
1 series · 1 of 1 positions shown · non-contrast
Comparison: None.

CLINICAL DATA: Status post right hip arthroplasty

EXAM:
PORTABLE PELVIS 1-2 VIEWS

[AP]
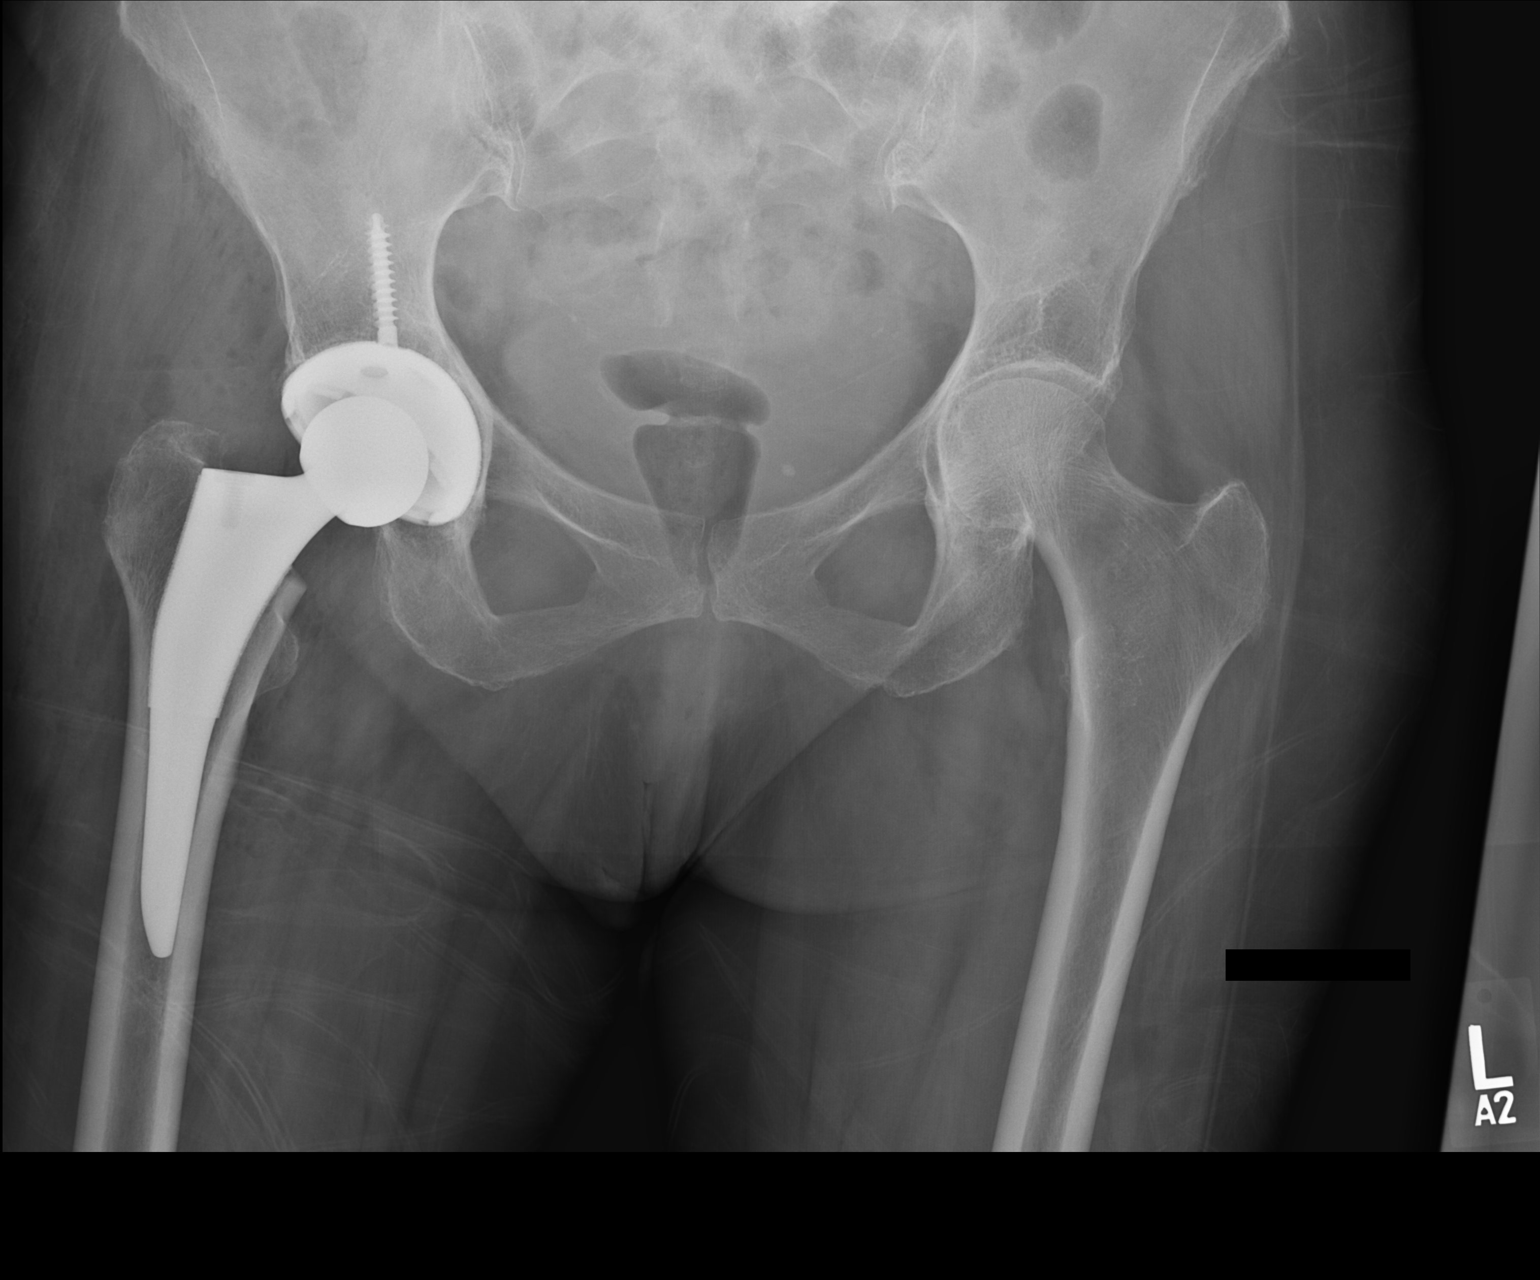

[1 of 1 positions shown; findings below may reference images not displayed]

FINDINGS: Right hip replacement is noted in satisfactory position. No acute
bony or soft tissue abnormality is noted.
IMPRESSION: Status post right hip arthroplasty

## 2019-06-03 ENCOUNTER — Other Ambulatory Visit: Payer: Self-pay | Admitting: "Endocrinology

## 2019-09-06 ENCOUNTER — Telehealth: Payer: Self-pay | Admitting: "Endocrinology

## 2019-09-06 DIAGNOSIS — E890 Postprocedural hypothyroidism: Secondary | ICD-10-CM

## 2019-09-06 NOTE — Telephone Encounter (Signed)
It is ordered to be done at [facility] in Feb 2021.

## 2019-09-06 NOTE — Telephone Encounter (Signed)
I do not see this in my workqueue.

## 2019-09-06 NOTE — Telephone Encounter (Signed)
Pt's daughter is calling about an ultrasound that you wanted her to have before her next appt in March. She needs to know when and where this will be.  Please Advise. [phone], [place]

## 2019-09-06 NOTE — Telephone Encounter (Signed)
Can you see if this referral is in your workque, she has an appt here in march and has to have this done prior.

## 2019-09-09 NOTE — Telephone Encounter (Signed)
Can you check on this? I have to call her daughter back and let her know

## 2019-09-09 NOTE — Telephone Encounter (Signed)
New order put in and sent to scheduling

## 2019-09-30 ENCOUNTER — Ambulatory Visit (HOSPITAL_COMMUNITY)
Admission: RE | Admit: 2019-09-30 | Discharge: 2019-09-30 | Disposition: A | Discharge status: Home / Self Care | Payer: Medicare Other | Source: Ambulatory Visit | Attending: "Endocrinology | Admitting: "Endocrinology

## 2019-09-30 ENCOUNTER — Other Ambulatory Visit: Payer: Self-pay

## 2019-09-30 DIAGNOSIS — E890 Postprocedural hypothyroidism: Secondary | ICD-10-CM | POA: Insufficient documentation

## 2019-10-22 ENCOUNTER — Other Ambulatory Visit: Payer: Self-pay

## 2019-10-22 DIAGNOSIS — E890 Postprocedural hypothyroidism: Secondary | ICD-10-CM

## 2019-10-23 LAB — T4, FREE: Free T4: 1.7 ng/dL (ref 0.8–1.8)

## 2019-10-23 LAB — THYROGLOBULIN LEVEL: Thyroglobulin: <0.1 ng/mL — ABNORMAL LOW

## 2019-10-23 LAB — TSH: TSH: 0.71 mIU/L (ref 0.40–4.50)

## 2019-10-23 LAB — THYROGLOBULIN ANTIBODY: Thyroglobulin Ab: <1 IU/mL (ref <=1)

## 2019-10-28 ENCOUNTER — Other Ambulatory Visit: Payer: Self-pay

## 2019-10-28 ENCOUNTER — Encounter: Payer: Self-pay | Admitting: "Endocrinology

## 2019-10-28 ENCOUNTER — Ambulatory Visit (INDEPENDENT_AMBULATORY_CARE_PROVIDER_SITE_OTHER): Payer: Medicare Other | Admitting: "Endocrinology

## 2019-10-28 VITALS — BP 152/88 | HR 74 | Ht 66.0 in | Wt 156.2 lb

## 2019-10-28 DIAGNOSIS — E890 Postprocedural hypothyroidism: Secondary | ICD-10-CM

## 2019-10-28 MED ORDER — LEVOTHYROXINE SODIUM 100 MCG PO TABS: ORAL_TABLET | ORAL | 3 refills | Status: AC

## 2019-10-28 NOTE — Progress Notes (Signed)
10/28/2019   Endocrinology follow-up note   Subjective:    Patient ID: Gwendolyn Bennett, female    DOB: March 30, 1928, PCP Kennieth Rad, MD   Past Medical History:  Diagnosis Date  . Arthritis   . Depression   . Dysrhythmia   . GERD (gastroesophageal reflux disease)   . Hypertension   . Hypothyroidism   . 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 Thyroid cancer ([place])   . Zollinger-Ellison syndrome    Past Surgical History:  Procedure Laterality Date  . ABDOMINAL HYSTERECTOMY    . LYSIS OF ADHESION    . OOPHORECTOMY    . THYROID SURGERY     times 2  . TOTAL HIP ARTHROPLASTY Right 10/17/2016   Procedure: RIGHT TOTAL HIP ARTHROPLASTY ANTERIOR APPROACH;  Surgeon: Rod Can, MD;  Location: [facility];  Service: Orthopedics;  Laterality: Right;   Social History   Socioeconomic History  . Marital status: Widowed    Spouse name: Not on file  . Number of children: Not on file  . Years of education: Not on file  . Highest education level: Not on file  Occupational History  . Not on file  Tobacco Use  . Smoking status: Never Smoker  . Smokeless tobacco: Never Used  Substance and Sexual Activity  . Alcohol use: No    Alcohol/week: 0.0 standard drinks  . Drug use: No  . Sexual activity: Not on file  Other Topics Concern  . Not on file  Social History Narrative  . Not on file   Social Determinants of Health   Financial Resource Strain:   . Difficulty of Paying Living Expenses: Not on file  Food Insecurity:   . Worried About Charity fundraiser in the Last Year: Not on file  . Ran Out of Food in the Last Year: Not on file  Transportation Needs:   . Lack of Transportation (Medical): Not on file  . Lack of Transportation (Non-Medical): Not on file  Physical Activity:   . Days of Exercise per Week: Not on file  . Minutes of Exercise per Session: Not on file  Stress:   . Feeling of Stress : Not on file  Social Connections:   . Frequency of  Communication with Friends and Family: Not on file  . Frequency of Social Gatherings with Friends and Family: Not on file  . Attends Religious Services: Not on file  . Active Member of Clubs or Organizations: Not on file  . Attends Archivist Meetings: Not on file  . Marital Status: Not on file   Outpatient Encounter Medications as of 10/28/2019  Medication Sig  . atenolol (TENORMIN) 50 MG tablet Take 50 mg by mouth daily.  . calcitRIOL (ROCALTROL) 0.25 MCG capsule Take 0.25 mcg by mouth daily.  . Calcium Citrate-Vitamin D (CITRACAL + D PO) Take 1 tablet by mouth 2 (two) times daily.  Marland Kitchen esomeprazole (NEXIUM) 40 MG capsule Take 40 mg by mouth 2 (two) times daily before a meal.  . FLUoxetine (PROZAC) 20 MG capsule Take 20 mg by mouth daily.   Marland Kitchen gabapentin (NEURONTIN) 100 MG capsule Take 100 mg by mouth 3 (three) times daily.  . hydrochlorothiazide (HYDRODIURIL) 25 MG tablet Take 25 mg by mouth daily.  Marland Kitchen levothyroxine (SYNTHROID) 100 MCG tablet As directed  . potassium chloride SA (KLOR-CON) 20 MEQ tablet Take 20 mEq by mouth daily.  . [DISCONTINUED] SYNTHROID 100 MCG tablet TAKE (1) TABLET BY MOUTH  DAILY BEFORE BREAKFAST.   No facility-administered encounter medications on file as of 10/28/2019.   ALLERGIES: Allergies  Allergen Reactions  . Amoxicillin-Pot Clavulanate Other (See Comments)    UNSPECIFIED REACTION   Has patient had a PCN reaction causing immediate rash, facial/tongue/throat swelling, SOB or lightheadedness with hypotension: UNKNOWN Has patient had a PCN reaction causing severe rash involving mucus membranes or skin necrosis: UNKNOWN Has patient had a PCN reaction that required hospitalization UNKNOWN Has patient had a PCN reaction occurring within the last 10 years: No If all of the above answers are "NO", then may proceed with Cephalosporin use. Unknown   . Levaquin [Levofloxacin In D5w] Other (See Comments)    UNSPECIFIED REACTION   . Codeine Nausea Only  .  Dexilant [Dexlansoprazole] Itching and Nausea And Vomiting  . Nitrofuran Derivatives Nausea And Vomiting  . Sulfa Antibiotics Rash   VACCINATION STATUS:  There is no immunization history on file for this patient.  Thyroid Problem Presents for follow-up visit.   Gwendolyn Bennett is 86 year old female patient with medical history as above. She is being seen in follow-up for postsurgical hypothyroidism with a new set of thyroid function tests.  She remains on Synthroid 100 mcg p.o. daily before breakfast.  She has no new complaints today.  She denies cold/heat intolerance. She has steady body weight. - She denies family history of thyroid cancer. She denies any exposure to neck radiation. -She had what appears to be total thyroidectomy in the late 90s for thyroid malignancy.  Her previsit thyroid/neck ultrasound is negative for any residual mass/lesions.    He denies dysphagia, shortness of breath, voice change.     Review of Systems   Review of systems  Constitutional: + Minimally fluctuating body weight,  current  Body mass index is 25.21 kg/m. , no fatigue, no subjective hyperthermia, no subjective hypothermia Eyes: no blurry vision, no xerophthalmia ENT: no sore throat, no nodules palpated in throat, no dysphagia/odynophagia, no hoarseness Cardiovascular: no Chest Pain, no Shortness of Breath, no palpitations, no leg swelling Respiratory: no cough, no shortness of breath Gastrointestinal: no Nausea/Vomiting/Diarhhea Musculoskeletal: no muscle/joint aches Skin: no rashes, no hyperemia Neurological: no tremors, no numbness, no tingling, no dizziness Psychiatric: no depression, no anxiety   Objective:    BP (!) 152/88   Pulse 74   Ht 5\' 6"  (1.676 m)   Wt 156 lb 3.2 oz (70.9 kg)   BMI 25.21 kg/m   Wt Readings from Last 3 Encounters:  10/28/19 156 lb 3.2 oz (70.9 kg)  09/28/18 151 lb (68.5 kg)  02/08/18 156 lb (70.8 kg)    Physical Exam   Physical Exam-  Limited  Constitutional:  Body mass index is 25.21 kg/m. , not in acute distress, normal state of mind Eyes:  EOMI, no exophthalmos Neck: Supple, + post thyroidectomy scar on anterior lower neck. Thyroid: No gross goiter Respiratory: Adequate breathing efforts Musculoskeletal: no gross deformities, strength intact in all four extremities, no gross restriction of joint movements Skin:  no rashes, no hyperemia Neurological: no tremor with outstretched hands,    Recent Results (from the past 2160 hour(s))  Thyroglobulin Level     Status: Abnormal   Collection Time: 10/22/19 10:52 AM  Result Value Ref Range   Thyroglobulin <0.1 (L) ng/mL    Comment:       Reference Range:       Intact Thyroid   2.8-40.9       Athyrotic        <0.1 .  Note: Abnormal flagging is based       on the reference interval for        patients with intact thyroid. . . This test was performed using the Beckman Coulter  chemiluminescent method. Values obtained from different assay methods cannot be used interchangeably. Thyroglobulin levels, regardless of value, should not be interpreted as absolute evidence of the presence or absence of disease. .    Comment      Comment: . Thyroglobulin antibodies (TGAB) interfere with thyroglobulin (TG) assays; therefore, TGAB assay should always be performed in conjunction with a TG assay. . . For additional information, please refer to  http://education.quest[facility].com/faq/FAQ202  (This link is being provided for informational/ educational purposes only.) .   Thyroglobulin antibody     Status: None   Collection Time: 10/22/19 10:52 AM  Result Value Ref Range   Thyroglobulin Ab <1 < or = 1 IU/mL  T4, Free     Status: None   Collection Time: 10/22/19 10:52 AM  Result Value Ref Range   Free T4 1.7 0.8 - 1.8 ng/dL  TSH     Status: None   Collection Time: 10/22/19 10:52 AM  Result Value Ref Range   TSH 0.71 0.40 - 4.50 mIU/L   - Thyroid/neck  ultrasound  on 07/27/2016 is consistent with surgically absent thyroid.  Assessment & Plan:   1. Postoperative hypothyroidism 2. Thyroid malignancy treated in the late 90s  -Her previsit thyroid function tests are consistent with appropriate replacement.  She is advised to continue Synthroid 100 mcg p.o. daily before breakfast.    - We discussed about the correct intake of her thyroid hormone, on empty stomach at fasting, with water, separated by at least 30 minutes from breakfast and other medications,  and separated by more than 4 hours from calcium, iron, multivitamins, acid reflux medications (PPIs). -Patient is made aware of the fact that thyroid hormone replacement is needed for life, dose to be adjusted by periodic monitoring of thyroid function tests.   Regarding her history of thyroid malignancy: - Her repeat surveillance neck/I wrote ultrasound was remarkable for surgically absent thyroid on September 30, 2019.  She would not need any further intervention or work-up for thyroid malignancy.    - I advised patient to maintain close follow up with Kennieth Rad, MD for primary care needs.     - Time spent on this patient care encounter:  25 minutes of which 50% was spent in  counseling and the rest reviewing  her current and  previous labs / studies and medications  doses and developing a plan for long term care. Shahad Delores Jeanty  participated in the discussions, expressed understanding, and voiced agreement with the above plans.  All questions were answered to her satisfaction. she is encouraged to contact clinic should she have any questions or concerns prior to her return visit.  Follow up plan: Return in about 1 year (around 10/27/2020) for Follow up with Pre-visit Labs.  Glade Lloyd, MD Phone: [phone]  Fax: [phone]  This note was partially dictated with voice recognition software. Similar sounding words can be transcribed inadequately or may not  be corrected upon  review.  10/28/2019, 4:20 PM

## 2020-10-27 ENCOUNTER — Ambulatory Visit: Payer: Medicare Other | Admitting: "Endocrinology

## 2021-09-17 IMAGING — US US SOFT TISSUE HEAD/NECK
1 series · 14 of 18 positions shown · non-contrast
Comparison: 07/27/2016

CLINICAL DATA: Other. History of thyroid cancer, post total
thyroidectomy.

EXAM:
THYROID ULTRASOUND
TECHNIQUE: Ultrasound examination of the thyroid gland and adjacent soft
tissues was performed.

[Series 1: us soft tissue head/neck · 0.06mm/px · 14 of 18 slices shown]
[im 1/18]
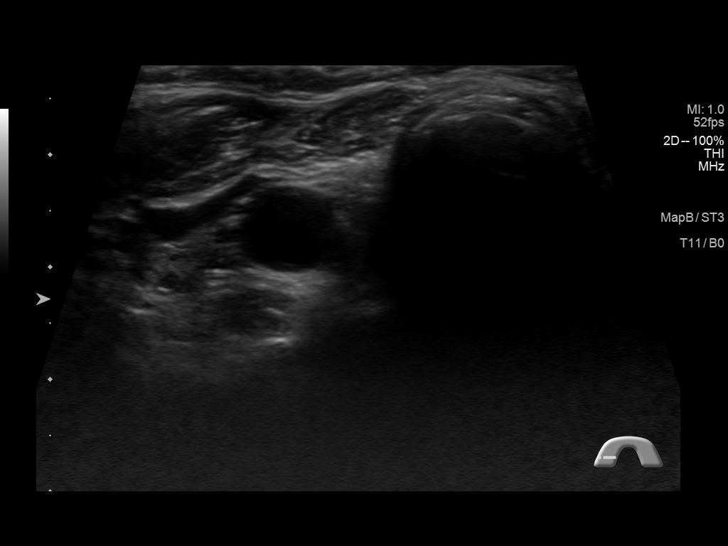
[im 2/18]
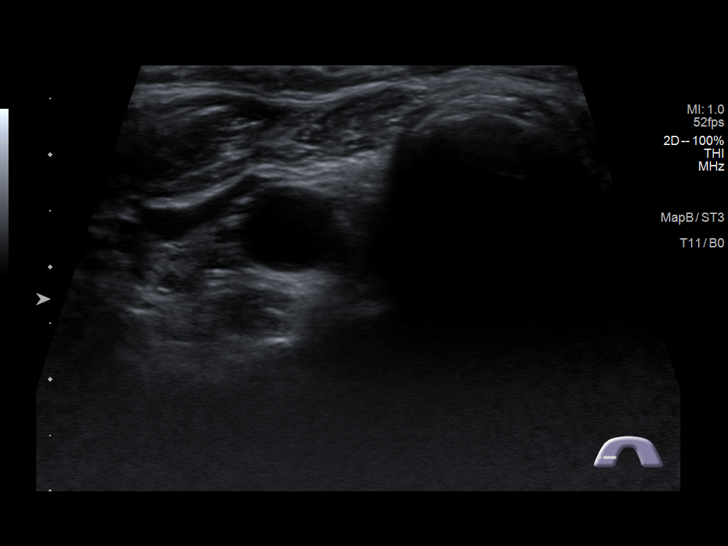
[im 4/18]
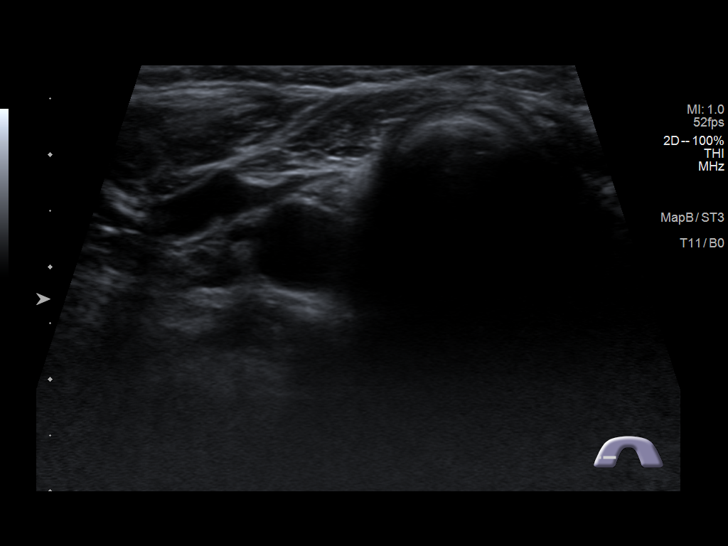
[im 5/18]
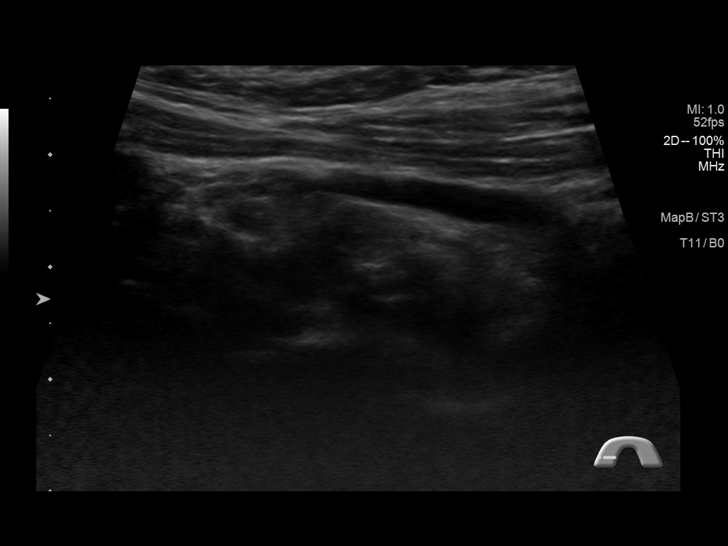
[im 6/18]
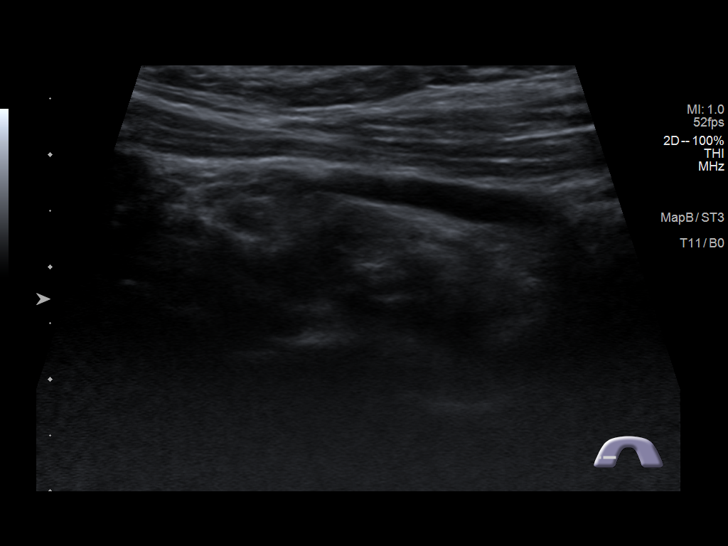
[im 8/18]
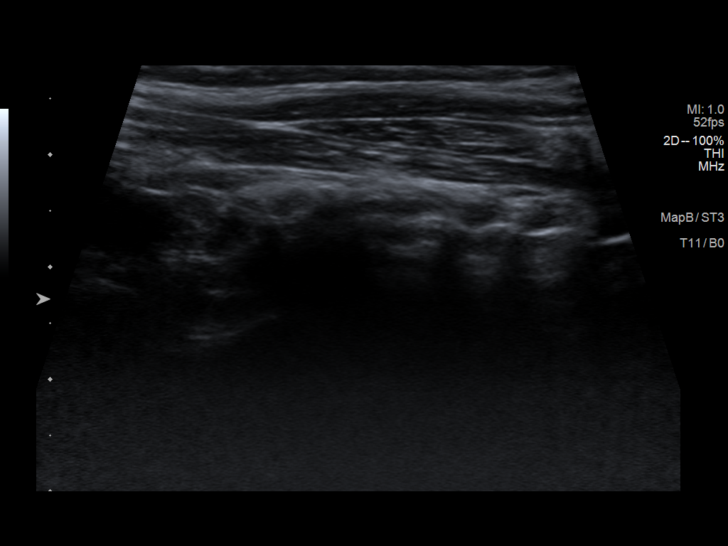
[im 9/18]
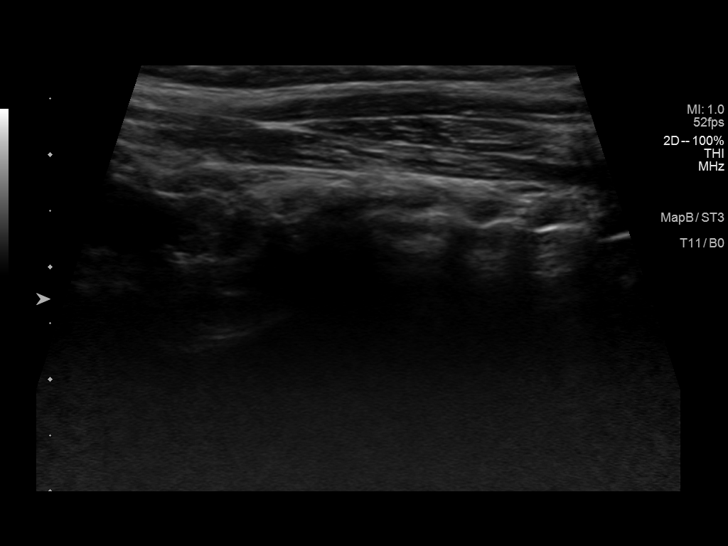
[im 10/18]
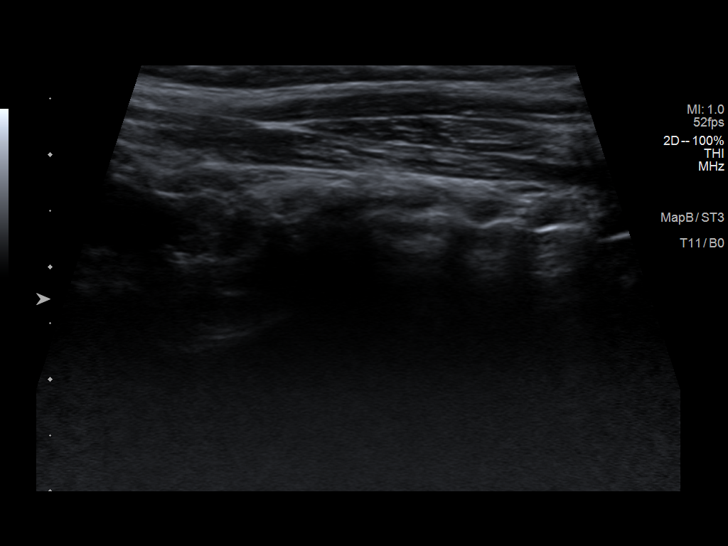
[im 11/18]
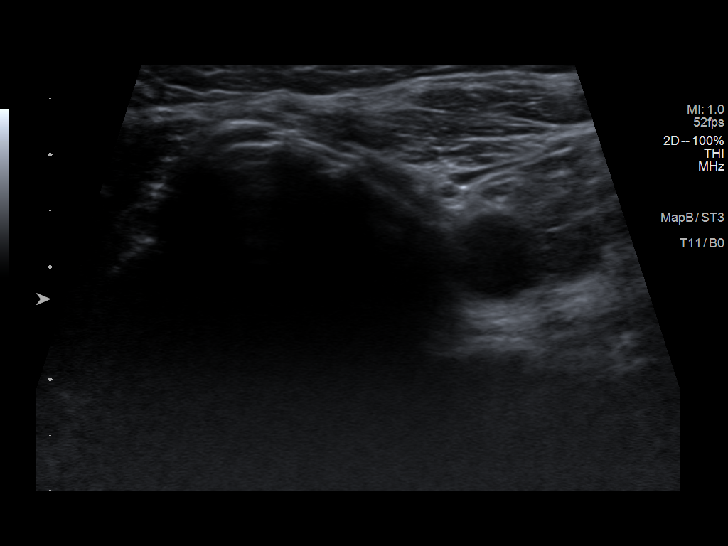
[im 13/18]
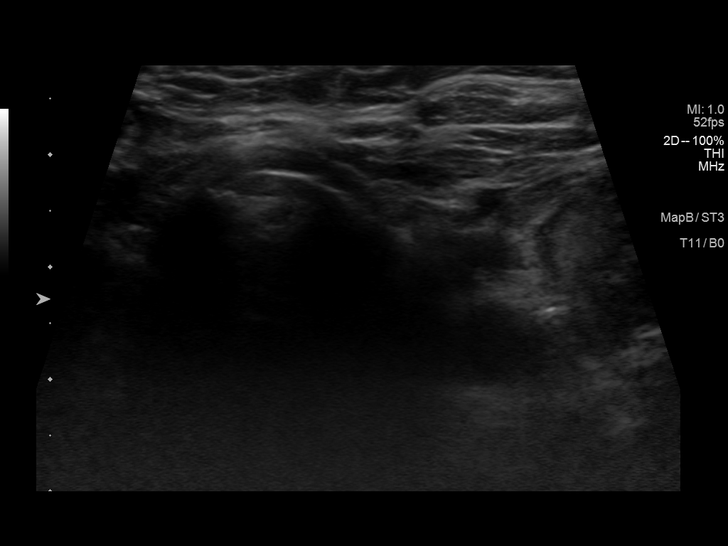
[im 14/18]
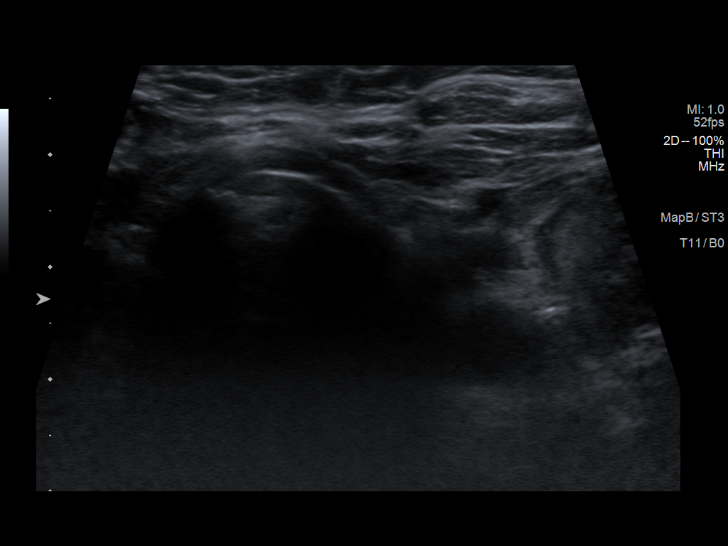
[im 15/18]
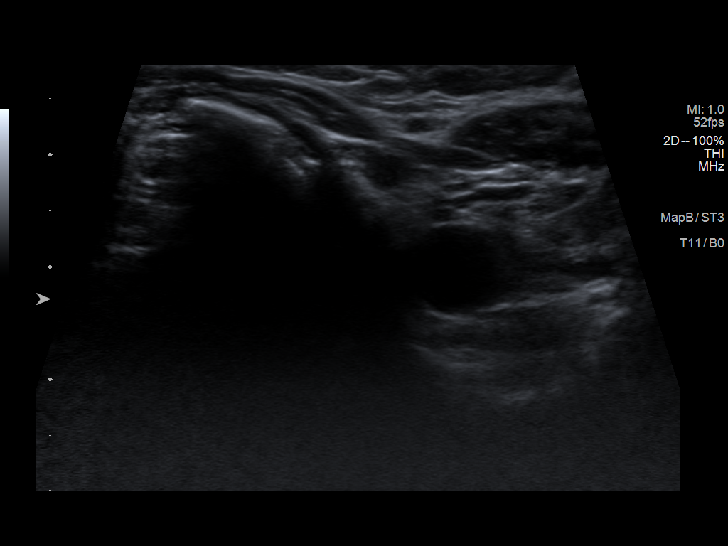
[im 17/18]
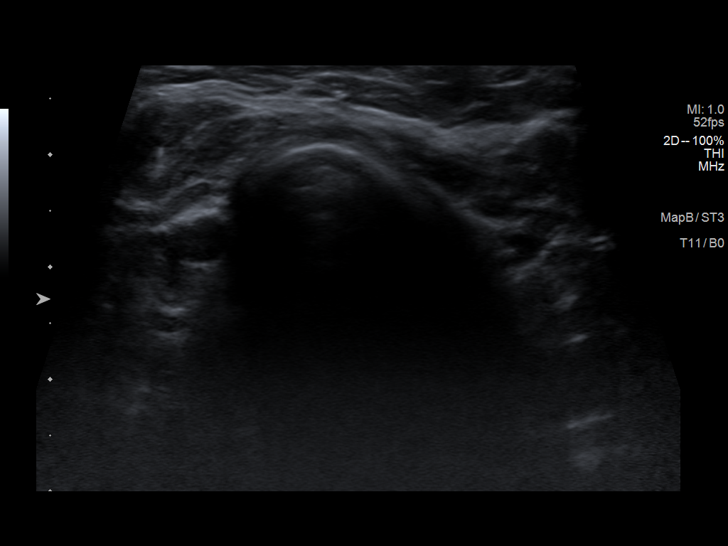
[im 18/18]
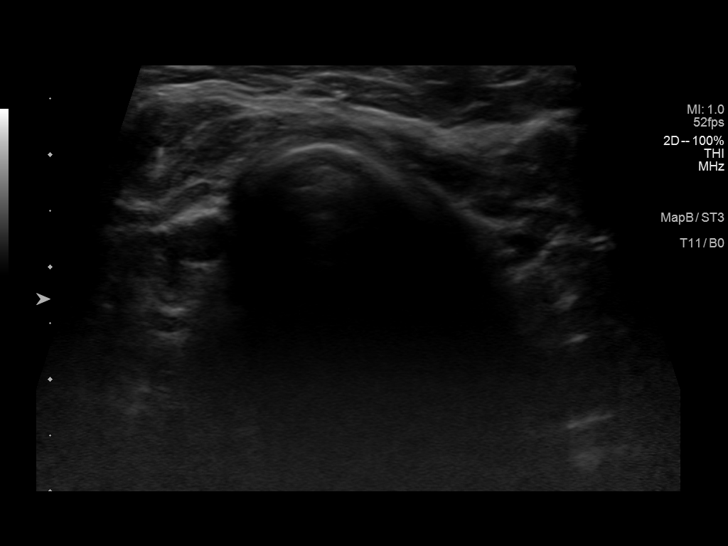

[14 of 18 positions shown; findings below may reference images not displayed]

FINDINGS: Isthmus: Surgically absent. There is no residual nodular soft tissue
within the isthmic resection bed.

Right lobe: Surgically absent. There is no residual nodular soft
tissue within the right lobectomy resection bed.

Left lobe: Surgically absent. There is no residual nodular soft
tissue within the left lobectomy resection bed.

_________________________________________________________

No regional cervical lymphadenopathy.
IMPRESSION: Post total thyroidectomy without evidence of residual or locally
recurrent disease.
